# Patient Record
Sex: Female | Born: 1995 | Race: White | Hispanic: No | Marital: Single | Smoking: Former smoker
Health system: Southern US, Community
[De-identification: ages and names within clinical notes are randomized; demographics above are authoritative.]

## PROBLEM LIST (undated history)

## (undated) ENCOUNTER — Inpatient Hospital Stay: Payer: Self-pay

## (undated) DIAGNOSIS — F32A Depression, unspecified: Secondary | ICD-10-CM

## (undated) DIAGNOSIS — F329 Major depressive disorder, single episode, unspecified: Secondary | ICD-10-CM

## (undated) DIAGNOSIS — F419 Anxiety disorder, unspecified: Secondary | ICD-10-CM

## (undated) DIAGNOSIS — G43019 Migraine without aura, intractable, without status migrainosus: Secondary | ICD-10-CM

## (undated) DIAGNOSIS — Z789 Other specified health status: Secondary | ICD-10-CM

## (undated) HISTORY — DX: Depression, unspecified: F32.A

## (undated) HISTORY — DX: Anxiety disorder, unspecified: F41.9

## (undated) HISTORY — PX: NO PAST SURGERIES: SHX2092

## (undated) HISTORY — DX: Migraine without aura, intractable, without status migrainosus: G43.019

## (undated) HISTORY — DX: Major depressive disorder, single episode, unspecified: F32.9

---
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
## 2015-08-22 ENCOUNTER — Ambulatory Visit: Payer: Self-pay | Admitting: Obstetrics and Gynecology

## 2015-12-26 ENCOUNTER — Emergency Department (HOSPITAL_COMMUNITY)
Admission: EM | Admit: 2015-12-26 | Discharge: 2015-12-26 | Disposition: A | Discharge status: Home / Self Care | Payer: Medicaid Other | Attending: Emergency Medicine | Admitting: Emergency Medicine

## 2015-12-26 ENCOUNTER — Encounter (HOSPITAL_COMMUNITY): Payer: Self-pay

## 2015-12-26 DIAGNOSIS — N760 Acute vaginitis: Secondary | ICD-10-CM | POA: Insufficient documentation

## 2015-12-26 DIAGNOSIS — Z3202 Encounter for pregnancy test, result negative: Secondary | ICD-10-CM | POA: Insufficient documentation

## 2015-12-26 DIAGNOSIS — B9689 Other specified bacterial agents as the cause of diseases classified elsewhere: Secondary | ICD-10-CM | POA: Diagnosis not present

## 2015-12-26 DIAGNOSIS — Z3200 Encounter for pregnancy test, result unknown: Secondary | ICD-10-CM | POA: Diagnosis present

## 2015-12-26 DIAGNOSIS — Z87891 Personal history of nicotine dependence: Secondary | ICD-10-CM | POA: Insufficient documentation

## 2015-12-26 LAB — I-STAT BETA HCG BLOOD, ED (MC, WL, AP ONLY): I-stat hCG, quantitative: <5 m[IU]/mL (ref <5)

## 2015-12-26 LAB — URINE MICROSCOPIC-ADD ON

## 2015-12-26 LAB — URINALYSIS, ROUTINE W REFLEX MICROSCOPIC
BILIRUBIN URINE: NEGATIVE
Glucose, UA: NEGATIVE mg/dL
Hgb urine dipstick: NEGATIVE
KETONES UR: NEGATIVE mg/dL
NITRITE: NEGATIVE
PROTEIN: NEGATIVE mg/dL
SPECIFIC GRAVITY, URINE: 1.017 (ref 1.005–1.030)
pH: 6 (ref 5.0–8.0)

## 2015-12-26 LAB — WET PREP, GENITAL
Sperm: NONE SEEN
TRICH WET PREP: NONE SEEN
YEAST WET PREP: NONE SEEN

## 2015-12-26 MED ORDER — METRONIDAZOLE 500 MG PO TABS: 500.0000 mg | ORAL_TABLET | Freq: Two times a day (BID) | ORAL | 0 refills | Status: DC

## 2015-12-26 NOTE — ED Triage Notes (Signed)
Pt reports nausea when she wakes up in the morning. She also has cravings and reports her LMP was 2 months ago.

## 2015-12-26 NOTE — ED Provider Notes (Signed)
[facility]-EMERGENCY DEPT Provider Note   CSN: [id number] Arrival date & time: 12/26/15  1704     History   Chief Complaint Chief Complaint  Patient presents with  . Possible Pregnancy    HPI Glenda Reid is a 20 y.o. female.  HPI   Pt presents to ED requesting pregnancy test.  States she has not had a period in several months - she thinks it may have been 3 months.  Has been having nausea and increased hunger, occasional lower abdominal cramping that is mild (not currently present), and increased white vaginal discharge daily x 3 months.  Denies fevers, vomiting, urinary symptoms, bowel changes, vaginal bleeding.   History reviewed. No pertinent past medical history.  There are no active problems to display for this patient.   History reviewed. No pertinent surgical history.  OB History    No data available       Home Medications    Prior to Admission medications   Medication Sig Start Date End Date Taking? Authorizing Provider  metroNIDAZOLE (FLAGYL) 500 MG tablet Take 1 tablet (500 mg total) by mouth 2 (two) times daily. 12/26/15   Trixie DredgeEmily Talayla Doyel, PA-C  naproxen (NAPROSYN) 500 MG tablet Take 1 tablet (500 mg total) by mouth 2 (two) times daily with a meal. 12/28/14   Joni Reiningonald K Smith, PA-C    Family History History reviewed. No pertinent family history.  Social History Social History  Substance Use Topics  . Smoking status: Former Games developermoker  . Smokeless tobacco: Never Used  . Alcohol use No     Allergies   Citalopram   Review of Systems Review of Systems  All other systems reviewed and are negative.    Physical Exam Updated Vital Signs BP 137/76 (BP Location: Left Arm)   Pulse 104   Temp 98.3 F (36.8 C) (Oral)   Resp 18   Ht 5\' 2"  (1.575 m)   Wt 53.5 kg   LMP 10/26/2015 (Within Weeks)   SpO2 100%   BMI 21.58 kg/m   Physical Exam  Constitutional: She appears well-developed and well-nourished. No distress.  HENT:  Head: Normocephalic and  atraumatic.  Neck: Neck supple.  Cardiovascular: Normal rate and regular rhythm.   Pulmonary/Chest: Effort normal and breath sounds normal. No respiratory distress. She has no wheezes. She has no rales.  Abdominal: Soft. She exhibits no distension. There is no tenderness. There is no rebound and no guarding.  Genitourinary: Uterus is not enlarged and not tender. Cervix exhibits no motion tenderness. Right adnexum displays no mass, no tenderness and no fullness. Left adnexum displays no mass, no tenderness and no fullness. No erythema, tenderness or bleeding in the vagina. No foreign body in the vagina. No signs of injury around the vagina.  Genitourinary Comments: Small amount of white discharge in vagina.    Neurological: She is alert.  Skin: She is not diaphoretic.  Nursing note and vitals reviewed.    ED Treatments / Results  Labs (all labs ordered are listed, but only abnormal results are displayed) Labs Reviewed  WET PREP, GENITAL - Abnormal; Notable for the following:       Result Value   Clue Cells Wet Prep HPF POC PRESENT (*)    WBC, Wet Prep HPF POC MANY (*)    All other components within normal limits  URINALYSIS, ROUTINE W REFLEX MICROSCOPIC (NOT AT [facility]) - Abnormal; Notable for the following:    APPearance CLOUDY (*)    Leukocytes, UA TRACE (*)  All other components within normal limits  URINE MICROSCOPIC-ADD ON - Abnormal; Notable for the following:    Squamous Epithelial / LPF 6-30 (*)    Bacteria, UA FEW (*)    All other components within normal limits  RPR  HIV ANTIBODY (ROUTINE TESTING)  I-STAT BETA HCG BLOOD, ED ([facility], [facility], [facility] ONLY)  GC/CHLAMYDIA PROBE AMP ([place]) NOT AT [facility]    EKG  EKG Interpretation None       Radiology No results found.  Procedures Procedures (including critical care time)  Medications Ordered in ED Medications - No data to display   Initial Impression / Assessment and Plan / ED Course  I have reviewed the triage vital  signs and the nursing notes.  Pertinent labs & imaging results that were available during my care of the patient were reviewed by me and considered in my medical decision making (see chart for details).  Clinical Course    Afebrile, nontoxic patient with symptoms of early pregnancy, no period in several months.  HCG negative.  Pelvic exam unremarkable.  Wet prep demonstrates clue cells and WBC.  STD/HIV pending.  UAdoes not appear infected.   D/C home with flagyl, gyn follow up.  Discussed result, findings, treatment, and follow up  with patient.  Pt given return precautions.  Pt verbalizes understanding and agrees with plan.       Final Clinical Impressions(s) / ED Diagnoses   Final diagnoses:  BV (bacterial vaginosis)    New Prescriptions New Prescriptions   METRONIDAZOLE (FLAGYL) 500 MG TABLET    Take 1 tablet (500 mg total) by mouth 2 (two) times daily.     Trixie Dredge, PA-C 12/26/15 2110    Pricilla Loveless, MD 12/29/15 [phone]

## 2015-12-26 NOTE — Discharge Instructions (Signed)
Read the information below.  Use the prescribed medication as directed.  Please discuss all new medications with your pharmacist.  You may return to the Emergency Department at any time for worsening condition or any new symptoms that concern you.    °

## 2015-12-27 LAB — GC/CHLAMYDIA PROBE AMP (~~LOC~~) NOT AT ARMC
Chlamydia: NEGATIVE
NEISSERIA GONORRHEA: NEGATIVE

## 2015-12-27 LAB — HIV ANTIBODY (ROUTINE TESTING W REFLEX): HIV Screen 4th Generation wRfx: NONREACTIVE

## 2015-12-27 LAB — RPR: RPR: NONREACTIVE

## 2016-02-17 ENCOUNTER — Encounter (HOSPITAL_COMMUNITY): Payer: Self-pay | Admitting: Emergency Medicine

## 2016-02-17 ENCOUNTER — Emergency Department (HOSPITAL_COMMUNITY)
Admission: EM | Admit: 2016-02-17 | Discharge: 2016-02-17 | Disposition: A | Discharge status: Home / Self Care | Payer: Medicaid Other | Attending: Emergency Medicine | Admitting: Emergency Medicine

## 2016-02-17 DIAGNOSIS — Z3201 Encounter for pregnancy test, result positive: Secondary | ICD-10-CM | POA: Insufficient documentation

## 2016-02-17 DIAGNOSIS — Z3A01 Less than 8 weeks gestation of pregnancy: Secondary | ICD-10-CM

## 2016-02-17 DIAGNOSIS — Z87891 Personal history of nicotine dependence: Secondary | ICD-10-CM | POA: Insufficient documentation

## 2016-02-17 DIAGNOSIS — Z3200 Encounter for pregnancy test, result unknown: Secondary | ICD-10-CM | POA: Diagnosis present

## 2016-02-17 LAB — POC URINE PREG, ED: PREG TEST UR: POSITIVE — AB

## 2016-02-17 MED ORDER — DOXYLAMINE-PYRIDOXINE 10-10 MG PO TBEC: 1.0000 | DELAYED_RELEASE_TABLET | ORAL | 0 refills | Status: AC

## 2016-02-17 MED ORDER — PRENATAL COMPLETE 14-0.4 MG PO TABS: 1.0000 | ORAL_TABLET | Freq: Every day | ORAL | 0 refills | Status: DC

## 2016-02-17 NOTE — ED Notes (Signed)
See providers assessment.  

## 2016-02-17 NOTE — ED Triage Notes (Signed)
Requesting pregnancy test.  States she had 2 positive test at home.

## 2016-02-17 NOTE — ED Provider Notes (Signed)
[facility] Provider Note   CSN: [id number] Arrival date & time: 02/17/16  1709 By signing my name below, I, Bridgette HabermannMaria Tan, attest that this documentation has been prepared under the direction and in the presence of Alvira MondayErin Christan Defranco, MD. Electronically Signed: Bridgette HabermannMaria Tan, ED Scribe. 02/17/16. 6:27 PM.  History   Chief Complaint Chief Complaint  Patient presents with  . Possible Pregnancy   HPI Comments: Glenda Reid is a 20 y.o. female with no pertinent PMHx, who presents to the Emergency Department requesting a pregnancy test. Pt states she had 2 positive pregnancy tests at home yesterday and is requesting an official test in order to avail pregnancy medicaid. Pt is complaining of nausea.  No abdominal pain, Reports bilateral hip pain. Pt's LMP was in November. Pt does not have an OB-GYN at this time. Pt denies fever, abd pain, vaginal bleeding, dysuria, vomiting, or any other associated symptoms.   The history is provided by the patient. No language interpreter was used.    History reviewed. No pertinent past medical history.  There are no active problems to display for this patient.   History reviewed. No pertinent surgical history.  OB History    No data available       Home Medications    Prior to Admission medications   Medication Sig Start Date End Date Taking? Authorizing Provider  metroNIDAZOLE (FLAGYL) 500 MG tablet Take 1 tablet (500 mg total) by mouth 2 (two) times daily. 12/26/15   Trixie DredgeEmily West, PA-C    Family History No family history on file.  Social History Social History  Substance Use Topics  . Smoking status: Former Games developermoker  . Smokeless tobacco: Never Used  . Alcohol use No     Allergies   Citalopram   Review of Systems Review of Systems  Constitutional: Negative for fever.  Gastrointestinal: Positive for nausea. Negative for abdominal pain, constipation, diarrhea and vomiting.  Genitourinary: Negative for dysuria, vaginal bleeding and  vaginal discharge.     Physical Exam Updated Vital Signs BP 119/74 (BP Location: Left Arm)   Pulse 86   Temp 98.4 F (36.9 C) (Oral)   Resp 18   LMP  (Approximate) Comment: "Sometime in November"- doesn't remember when  SpO2 100%   Physical Exam  Constitutional: She appears well-developed and well-nourished.  HENT:  Head: Normocephalic.  Eyes: Conjunctivae are normal.  Cardiovascular: Normal rate.   Pulmonary/Chest: Effort normal. No respiratory distress.  Abdominal: Soft. She exhibits no distension. There is no tenderness. There is no CVA tenderness.  Musculoskeletal: Normal range of motion.  Neurological: She is alert.  Skin: Skin is warm and dry.  Psychiatric: She has a normal mood and affect. Her behavior is normal.  Nursing note and vitals reviewed.  ED Treatments / Results  DIAGNOSTIC STUDIES: Oxygen Saturation is 100% on RA, normal by my interpretation.    COORDINATION OF CARE: 6:27 PM Discussed treatment plan with pt at bedside and pt agreed to plan.  Labs (all labs ordered are listed, but only abnormal results are displayed) Labs Reviewed  POC URINE PREG, ED - Abnormal; Notable for the following:       Result Value   Preg Test, Ur POSITIVE (*)    All other components within normal limits    EKG  EKG Interpretation None       Radiology No results found.  Procedures Procedures (including critical care time)  Medications Ordered in ED Medications - No data to display   Initial Impression / Assessment  and Plan / ED Course  I have reviewed the triage vital signs and the nursing notes.  Pertinent labs & imaging results that were available during my care of the patient were reviewed by me and considered in my medical decision making (see chart for details).  Clinical Course    20yo female presents with wanting confirmation of pregnancy.  Patient without abdominal pain, no vaginal bleeding, do not suspect ectopic at this time.  Feel further  screening is appropriate to do through OBGYN or PCP. Given rx for diclegis and prenatal vitamins. Patient discharged in stable condition with understanding of reasons to return.   Final Clinical Impressions(s) / ED Diagnoses   Final diagnoses:  Less than [redacted] weeks gestation of pregnancy    New Prescriptions Current Discharge Medication List     I personally performed the services described in this documentation, which was scribed in my presence. The recorded information has been reviewed and is accurate.     Alvira MondayErin Bell Carbo, MD 02/18/16 [phone]
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
## 2016-03-18 ENCOUNTER — Other Ambulatory Visit (HOSPITAL_COMMUNITY): Payer: Self-pay | Admitting: Nurse Practitioner

## 2016-03-18 DIAGNOSIS — Z3A13 13 weeks gestation of pregnancy: Secondary | ICD-10-CM

## 2016-03-18 DIAGNOSIS — Z3682 Encounter for antenatal screening for nuchal translucency: Secondary | ICD-10-CM

## 2016-04-11 ENCOUNTER — Encounter (HOSPITAL_COMMUNITY): Payer: Self-pay | Admitting: *Deleted

## 2016-04-14 ENCOUNTER — Ambulatory Visit (HOSPITAL_COMMUNITY)
Admission: RE | Admit: 2016-04-14 | Discharge: 2016-04-14 | Disposition: A | Discharge status: Home / Self Care | Payer: Medicaid Other | Source: Ambulatory Visit | Attending: Nurse Practitioner | Admitting: Nurse Practitioner

## 2016-04-14 ENCOUNTER — Encounter (HOSPITAL_COMMUNITY): Payer: Self-pay

## 2016-04-14 DIAGNOSIS — Z3A12 12 weeks gestation of pregnancy: Secondary | ICD-10-CM | POA: Diagnosis not present

## 2016-04-14 DIAGNOSIS — Z3A13 13 weeks gestation of pregnancy: Secondary | ICD-10-CM

## 2016-04-14 DIAGNOSIS — Z3682 Encounter for antenatal screening for nuchal translucency: Secondary | ICD-10-CM | POA: Insufficient documentation

## 2016-04-14 HISTORY — DX: Other specified health status: Z78.9

## 2016-04-16 ENCOUNTER — Other Ambulatory Visit: Payer: Self-pay

## 2016-07-11 ENCOUNTER — Ambulatory Visit (INDEPENDENT_AMBULATORY_CARE_PROVIDER_SITE_OTHER): Payer: Self-pay | Admitting: Neurology

## 2016-07-11 ENCOUNTER — Telehealth: Payer: Self-pay | Admitting: Neurology

## 2016-07-11 ENCOUNTER — Encounter: Payer: Self-pay | Admitting: Neurology

## 2016-07-11 ENCOUNTER — Encounter (INDEPENDENT_AMBULATORY_CARE_PROVIDER_SITE_OTHER): Payer: Self-pay

## 2016-07-11 DIAGNOSIS — G43611 Persistent migraine aura with cerebral infarction, intractable, with status migrainosus: Secondary | ICD-10-CM

## 2016-07-11 DIAGNOSIS — I639 Cerebral infarction, unspecified: Secondary | ICD-10-CM

## 2016-07-11 NOTE — Telephone Encounter (Signed)
This patient arrived without her $3 co-pay, she therefore no showed for the appointment.

## 2016-07-13 ENCOUNTER — Encounter: Payer: Self-pay | Admitting: Neurology

## 2016-07-13 DIAGNOSIS — G43611 Persistent migraine aura with cerebral infarction, intractable, with status migrainosus: Secondary | ICD-10-CM | POA: Insufficient documentation

## 2016-07-13 DIAGNOSIS — I639 Cerebral infarction, unspecified: Principal | ICD-10-CM

## 2016-07-13 NOTE — Progress Notes (Signed)
Patient was not see, did not have copay.

## 2016-07-18 ENCOUNTER — Ambulatory Visit: Payer: Medicaid Other | Admitting: Neurology

## 2016-07-30 ENCOUNTER — Ambulatory Visit (INDEPENDENT_AMBULATORY_CARE_PROVIDER_SITE_OTHER): Payer: Medicaid Other | Admitting: Neurology

## 2016-07-30 ENCOUNTER — Encounter: Payer: Self-pay | Admitting: Neurology

## 2016-07-30 DIAGNOSIS — G43019 Migraine without aura, intractable, without status migrainosus: Secondary | ICD-10-CM | POA: Diagnosis not present

## 2016-07-30 HISTORY — DX: Migraine without aura, intractable, without status migrainosus: G43.019

## 2016-07-30 MED ORDER — AMITRIPTYLINE HCL 10 MG PO TABS: ORAL_TABLET | ORAL | 3 refills | Status: AC

## 2016-07-30 NOTE — Progress Notes (Signed)
Reason for visit: Migraine headache  Referring physician: Dr. Tawana Scale is a 21 y.o. female  History of present illness:  Ms. Calder is a 21 year old right-handed white female with history of migraine headaches throughout her life. She believes that she began having headaches around age 21 or 69. Over the last year the headaches become more frequent and have occurred on an every other day basis. The patient currently is pregnant, the due date is on 10/25/2016. The patient has a history of daily marijuana use, but she has apparently stopped after she learned she was pregnant. The patient indicates that her headaches generally are in the right temporal area, occasionally they may be on the left side of the head. The headaches are associated with photophobia and phonophobia and some nausea without vomiting. When the headaches are severe she may have some cognitive clouding and dizziness with the headache. The patient denies any numbness or weakness of the face, arms, or legs. Currently she is taking Tylenol for the headache, she has found that sleep will also help the headache. She takes Flexeril occasionally if needed as well. The patient indicates that her mother and her brother also had migraine headache. The patient does not know of any activating factors for her headache. She is sent to this office for an evaluation. She has never had a CT scan or MRI of the brain to evaluate her headache.  Past Medical History:  Diagnosis Date  . Anxiety   . Depression   . Medical history non-contributory     Past Surgical History:  Procedure Laterality Date  . NO PAST SURGERIES      Family History  Problem Relation Age of Onset  . Seizures Mother   . Seizures Maternal Grandmother     Social history:  reports that she has quit smoking. She has never used smokeless tobacco. She reports that she uses drugs, including Marijuana. She reports that she does not drink  alcohol.  Medications:  Prior to Admission medications   Medication Sig Start Date End Date Taking? Authorizing Provider  Acetaminophen (TYLENOL PO) Take 1 Dose by mouth as needed.   Yes [provider]  cyclobenzaprine (FLEXERIL) 10 MG tablet Take 10 mg by mouth every 8 (eight) hours as needed for muscle spasms.   Yes [provider]  Doxylamine-Pyridoxine 10-10 MG TBEC Take 1 tablet by mouth See admin instructions. 2 tablets orally at bedtime on day one and two, if symptoms persist, take 1 tablet in the morning and 2 tablets at bedtime on day 3, if symptoms persist, may increase to MAX 4 tablets per day, administered as 1 tablet in the morning, 1 tablet in mid afternoon and 2 tablets at bedtime 02/17/16  Yes Alvira Monday, MD  metroNIDAZOLE (FLAGYL) 500 MG tablet Take 1 tablet (500 mg total) by mouth 2 (two) times daily. 12/26/15  Yes West, Irving Burton, PA-C  Prenatal Vit-Fe Fumarate-FA (PRENATAL COMPLETE) 14-0.4 MG TABS Take 1 tablet by mouth daily. 02/17/16  Yes Alvira Monday, MD      Allergies  Allergen Reactions  . Citalopram Rash    ROS:  Out of a complete 14 system review of symptoms, the patient complains only of the following symptoms, and all other reviewed systems are negative.  Headache, dizziness  Blood pressure 108/60, pulse (!) 108, height 5\' 2"  (1.575 m), weight 134 lb (60.8 kg), last menstrual period 01/14/2016.  Physical Exam  General: The patient is alert and cooperative at the time  of the examination. The patient is pregnant.  Eyes: Pupils are equal, round, and reactive to light. Discs are flat bilaterally.  Neck: The neck is supple, no carotid bruits are noted.  Respiratory: The respiratory examination is clear.  Cardiovascular: The cardiovascular examination reveals a regular rate and rhythm, no obvious murmurs or rubs are noted.  Neuromuscular: Range of movement of the cervical spine is full. No crepitus within the temporomandibular  joints is noted.  Skin: Extremities are without significant edema.  Neurologic Exam  Mental status: The patient is alert and oriented x 3 at the time of the examination. The patient has apparent normal recent and remote memory, with an apparently normal attention span and concentration ability.  Cranial nerves: Facial symmetry is present. There is good sensation of the face to pinprick and soft touch bilaterally. The strength of the facial muscles and the muscles to head turning and shoulder shrug are normal bilaterally. Speech is well enunciated, no aphasia or dysarthria is noted. Extraocular movements are full. Visual fields are full. The tongue is midline, and the patient has symmetric elevation of the soft palate. No obvious hearing deficits are noted.  Motor: The motor testing reveals 5 over 5 strength of all 4 extremities. Good symmetric motor tone is noted throughout.  Sensory: Sensory testing is intact to pinprick, soft touch, vibration sensation, and position sense on all 4 extremities. No evidence of extinction is noted.  Coordination: Cerebellar testing reveals good finger-nose-finger and heel-to-shin bilaterally.  Gait and station: Gait is normal. Tandem gait is normal. Romberg is negative. No drift is seen.  Reflexes: Deep tendon reflexes are symmetric and normal bilaterally. Toes are downgoing bilaterally.   Assessment/Plan:  1. Common migraine headache, intractable  The patient will be placed on low-dose amitriptyline, she will call for any dose adjustments. The patient may take Tylenol if needed for the headache. She will call if any problems arise. Amitriptyline is safe during pregnancy and during breast-feeding. She will follow-up in 3 months.   Marlan Palau. Keith Willis MD 07/30/2016 8:30 AM  [facility] [address] [place], [place] [id number]  Phone [phone] Fax [phone]

## 2016-07-30 NOTE — Patient Instructions (Signed)
We will start amitriptyline at night for the headache.  Elavil (amitriptyline) is an antidepressant medication that has many uses that may include headache, whiplash injuries, or for peripheral neuropathy pain. Side effects may include drowsiness, dry mouth, blurred vision, or constipation. As with any antidepressant medication, worsening depression may occur. If you had any significant side effects, please call our office. The full effects of this medication may take 7-10 days after starting the drug, or going up on the dose.

## 2016-09-01 ENCOUNTER — Observation Stay
Admission: EM | Admit: 2016-09-01 | Discharge: 2016-09-02 | Disposition: A | Discharge status: Home / Self Care | Payer: Medicaid Other | Attending: Obstetrics and Gynecology | Admitting: Obstetrics and Gynecology

## 2016-09-01 DIAGNOSIS — Z888 Allergy status to other drugs, medicaments and biological substances status: Secondary | ICD-10-CM | POA: Diagnosis not present

## 2016-09-01 DIAGNOSIS — O360930 Maternal care for other rhesus isoimmunization, third trimester, not applicable or unspecified: Secondary | ICD-10-CM | POA: Insufficient documentation

## 2016-09-01 DIAGNOSIS — Z3A32 32 weeks gestation of pregnancy: Secondary | ICD-10-CM | POA: Insufficient documentation

## 2016-09-01 DIAGNOSIS — O479 False labor, unspecified: Secondary | ICD-10-CM

## 2016-09-01 DIAGNOSIS — Z87891 Personal history of nicotine dependence: Secondary | ICD-10-CM | POA: Insufficient documentation

## 2016-09-01 DIAGNOSIS — O4693 Antepartum hemorrhage, unspecified, third trimester: Secondary | ICD-10-CM

## 2016-09-01 DIAGNOSIS — O4700 False labor before 37 completed weeks of gestation, unspecified trimester: Secondary | ICD-10-CM

## 2016-09-01 MED ORDER — TERBUTALINE SULFATE 1 MG/ML IJ SOLN
INTRAMUSCULAR | Status: AC
  Administered 2016-09-01: 0.25 mg via SUBCUTANEOUS
  Filled 2016-09-01: qty 1

## 2016-09-01 MED ORDER — TERBUTALINE SULFATE 1 MG/ML IJ SOLN
0.2500 mg | Freq: Once | INTRAMUSCULAR | Status: AC
  Administered 2016-09-01: 0.25 mg via SUBCUTANEOUS

## 2016-09-01 NOTE — OB Triage Note (Signed)
Patient came in for observation for labor evaluation. Patient reports contractions every 5 to 20 minutes. Patient rates pain 5-10 out of 10. Patient states some contractions are very intense and some just feel like menstrual cramps. Patient denies leaking of fluid, denies vaginal bleeding and spotting. Patient states she has been feeling the baby move. Patient denies any other complaints at this time. Vital signs stable and patient afebrile. FHR baseline 130 with moderate variability with accelerations 15 x 15 and no decelerations. Mother at bedside. Will continue to monitor.

## 2016-09-02 ENCOUNTER — Observation Stay: Payer: Medicaid Other

## 2016-09-02 DIAGNOSIS — O4700 False labor before 37 completed weeks of gestation, unspecified trimester: Secondary | ICD-10-CM | POA: Diagnosis present

## 2016-09-02 DIAGNOSIS — O479 False labor, unspecified: Secondary | ICD-10-CM | POA: Diagnosis present

## 2016-09-02 LAB — URINALYSIS, COMPLETE (UACMP) WITH MICROSCOPIC
BILIRUBIN URINE: NEGATIVE
Glucose, UA: 50 mg/dL — AB
HGB URINE DIPSTICK: NEGATIVE
KETONES UR: 20 mg/dL — AB
NITRITE: NEGATIVE
Protein, ur: NEGATIVE mg/dL
SPECIFIC GRAVITY, URINE: 1.004 — AB (ref 1.005–1.030)
pH: 6 (ref 5.0–8.0)

## 2016-09-02 LAB — CHLAMYDIA/NGC RT PCR (ARMC ONLY)
Chlamydia Tr: NOT DETECTED
N gonorrhoeae: NOT DETECTED

## 2016-09-02 LAB — CBC
HCT: 30.1 % — ABNORMAL LOW (ref 35.0–47.0)
Hemoglobin: 10.4 g/dL — ABNORMAL LOW (ref 12.0–16.0)
MCH: 27.4 pg (ref 26.0–34.0)
MCHC: 34.4 g/dL (ref 32.0–36.0)
MCV: 79.6 fL — AB (ref 80.0–100.0)
PLATELETS: 261 10*3/uL (ref 150–440)
RBC: 3.79 MIL/uL — ABNORMAL LOW (ref 3.80–5.20)
RDW: 13.1 % (ref 11.5–14.5)
WBC: 19.7 10*3/uL — AB (ref 3.6–11.0)

## 2016-09-02 LAB — ABO/RH: ABO/RH(D): O NEG

## 2016-09-02 LAB — URINE DRUG SCREEN, QUALITATIVE (ARMC ONLY)
Amphetamines, Ur Screen: NOT DETECTED
BENZODIAZEPINE, UR SCRN: NOT DETECTED
Barbiturates, Ur Screen: NOT DETECTED
CANNABINOID 50 NG, UR ~~LOC~~: NOT DETECTED
Cocaine Metabolite,Ur ~~LOC~~: NOT DETECTED
MDMA (Ecstasy)Ur Screen: NOT DETECTED
Methadone Scn, Ur: NOT DETECTED
Opiate, Ur Screen: NOT DETECTED
PHENCYCLIDINE (PCP) UR S: NOT DETECTED
Tricyclic, Ur Screen: POSITIVE — AB

## 2016-09-02 MED ORDER — LACTATED RINGERS IV SOLN: INTRAVENOUS | Status: DC

## 2016-09-02 MED ORDER — LACTATED RINGERS IV SOLN
Freq: Once | INTRAVENOUS | Status: AC
  Administered 2016-09-02: 01:00:00 via INTRAVENOUS

## 2016-09-02 MED ORDER — TERBUTALINE SULFATE 1 MG/ML IJ SOLN
0.2500 mg | Freq: Once | INTRAMUSCULAR | Status: AC
  Administered 2016-09-02: 0.25 mg via SUBCUTANEOUS

## 2016-09-02 MED ORDER — RHO D IMMUNE GLOBULIN 1500 UNIT/2ML IJ SOSY
300.0000 ug | PREFILLED_SYRINGE | Freq: Once | INTRAMUSCULAR | Status: DC
  Filled 2016-09-02: qty 2
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
## 2016-09-02 NOTE — Discharge Instructions (Signed)

## 2016-09-02 NOTE — H&P (Signed)
Patient ID: Glenda Reid, female   DOB: 12/06/1995, 21 y.o.   MRN: [id number]  Glenda Reid is a 21 y.o. female. She is at [redacted]w[redacted]d gestation. Patient's last menstrual period was 01/14/2016 (approximate). Estimated Date of Delivery: 10/25/16  Prenatal care site: [place]  Chief complaint: Ctx throughout day starting at 4 pm  No LOF , No Vaginal bleeding  Outside most of the day and only consumed 32 oz liquid    Maternal Medical History:   Past Medical History:  Diagnosis Date  . Anxiety   . Common migraine with intractable migraine 07/30/2016  . Depression   . Medical history non-contributory     Past Surgical History:  Procedure Laterality Date  . NO PAST SURGERIES      Allergies  Allergen Reactions  . Citalopram Rash    Prior to Admission medications   Medication Sig Start Date End Date Taking? Authorizing Provider  amitriptyline (ELAVIL) 10 MG tablet Take one tablet at night for one week, then take 2 tablets at night for one week, then take 3 tablets at night. 07/30/16  Yes York Spaniel, MD  Prenatal Vit-Fe Fumarate-FA (PRENATAL COMPLETE) 14-0.4 MG TABS Take 1 tablet by mouth daily. 02/17/16  Yes Alvira Monday, MD  Acetaminophen (TYLENOL PO) Take 1 Dose by mouth as needed.    [provider]  cyclobenzaprine (FLEXERIL) 10 MG tablet Take 10 mg by mouth every 8 (eight) hours as needed for muscle spasms.    [provider]  Doxylamine-Pyridoxine 10-10 MG TBEC Take 1 tablet by mouth See admin instructions. 2 tablets orally at bedtime on day one and two, if symptoms persist, take 1 tablet in the morning and 2 tablets at bedtime on day 3, if symptoms persist, may increase to MAX 4 tablets per day, administered as 1 tablet in the morning, 1 tablet in mid afternoon and 2 tablets at bedtime 02/17/16   Alvira Monday, MD  metroNIDAZOLE (FLAGYL) 500 MG tablet Take 1 tablet (500 mg total) by mouth 2 (two) times daily. Patient not  taking: Reported on 09/01/2016 12/26/15   Trixie Dredge, PA-C     Social History: She  reports that she has quit smoking. She has never used smokeless tobacco. She reports that she uses drugs, including Marijuana. She reports that she does not drink alcohol.  Family History: family history includes Seizures in her maternal grandmother and mother.  no history of gyn cancers  Review of Systems: A full review of systems was performed and negative except as noted in the HPI.   General : no weight loss Endocrine No thyroid disease  Pulm : No SOB , asthma  CV NO chest pain   GI: No IBS  GU , No STD  Neurologic : No seizure d/o  O:  BP 126/82 (BP Location: Right Arm)   Pulse (!) 110   Temp 98.4 F (36.9 C) (Oral)   Resp 18   Ht 5\' 2"  (1.575 m)   Wt 61.2 kg (135 lb)   LMP 01/14/2016 (Approximate)   BMI 24.69 kg/m  No results found for this or any previous visit (from the past 48 hour(s)).   Constitutional: NAD, AAOx3  HE/ENT: extraocular movements grossly intact, moist mucous membranes CV: RRR PULM: nl respiratory effort, CTABL     Abd: gravid, non-tender, non-distended, soft      Ext: Non-tender, Nonedmeatous   Psych: mood appropriate, speech normal Pelvic by RN intially 1.5 cm dilated Exam by me at 0145 on  09/02/16 : TFT with old dark blood , / 50% / BLT   NST: Reactive ,   Baseline: 150 Variability: moderate Accelerations present x >2 Decelerations absent  CTX : uterine irritability  Time 20mins    A/P: 21 y.o. 582w3d here for antenatal surveillance for PTL Contractions / pressure decreased but blood on glove may be c/w cervical change , vs partial abruption   Will get pelvic u/s  Blood type rh negative  And she will receive rhogam  Cont IVF and get CBC and UDS observ over night    ----- Suzy Bouchardhomas J Adden Strout , MD Attending Obstetrician and Gynecologist [facility], Department of OB/GYN [facility]

## 2016-09-04 LAB — BPAM RBC
BLOOD PRODUCT EXPIRATION DATE: 201808012359
Blood Product Expiration Date: 201808012359
UNIT TYPE AND RH: 9500
Unit Type and Rh: 9500

## 2016-09-04 LAB — TYPE AND SCREEN
ABO/RH(D): O NEG
ANTIBODY SCREEN: POSITIVE
UNIT DIVISION: 0
Unit division: 0

## 2016-09-22 ENCOUNTER — Inpatient Hospital Stay
Admission: EM | Admit: 2016-09-22 | Discharge: 2016-09-25 | DRG: 775 | Disposition: A | Discharge status: Home / Self Care | Payer: Medicaid Other | Attending: Obstetrics and Gynecology | Admitting: Obstetrics and Gynecology

## 2016-09-22 ENCOUNTER — Inpatient Hospital Stay: Payer: Medicaid Other

## 2016-09-22 DIAGNOSIS — D649 Anemia, unspecified: Secondary | ICD-10-CM | POA: Diagnosis present

## 2016-09-22 DIAGNOSIS — Z3A35 35 weeks gestation of pregnancy: Secondary | ICD-10-CM

## 2016-09-22 DIAGNOSIS — O9902 Anemia complicating childbirth: Secondary | ICD-10-CM | POA: Diagnosis present

## 2016-09-22 DIAGNOSIS — O4290 Premature rupture of membranes, unspecified as to length of time between rupture and onset of labor, unspecified weeks of gestation: Secondary | ICD-10-CM

## 2016-09-22 DIAGNOSIS — O42913 Preterm premature rupture of membranes, unspecified as to length of time between rupture and onset of labor, third trimester: Secondary | ICD-10-CM | POA: Diagnosis present

## 2016-09-22 DIAGNOSIS — Z87891 Personal history of nicotine dependence: Secondary | ICD-10-CM | POA: Diagnosis not present

## 2016-09-22 DIAGNOSIS — O42919 Preterm premature rupture of membranes, unspecified as to length of time between rupture and onset of labor, unspecified trimester: Secondary | ICD-10-CM | POA: Diagnosis present

## 2016-09-22 LAB — CBC
HCT: 32.8 % — ABNORMAL LOW (ref 35.0–47.0)
Hemoglobin: 10.9 g/dL — ABNORMAL LOW (ref 12.0–16.0)
MCH: 26.3 pg (ref 26.0–34.0)
MCHC: 33.3 g/dL (ref 32.0–36.0)
MCV: 79.1 fL — ABNORMAL LOW (ref 80.0–100.0)
Platelets: 260 10*3/uL (ref 150–440)
RBC: 4.15 MIL/uL (ref 3.80–5.20)
RDW: 13.7 % (ref 11.5–14.5)
WBC: 17.5 10*3/uL — AB (ref 3.6–11.0)

## 2016-09-22 LAB — ROM PLUS (ARMC ONLY): ROM PLUS: POSITIVE

## 2016-09-22 LAB — RAPID HIV SCREEN (HIV 1/2 AB+AG)
HIV 1/2 ANTIBODIES: NONREACTIVE
HIV-1 P24 ANTIGEN - HIV24: NONREACTIVE

## 2016-09-22 MED ORDER — MISOPROSTOL 25 MCG QUARTER TABLET
ORAL_TABLET | ORAL | Status: AC
  Filled 2016-09-22: qty 1

## 2016-09-22 MED ORDER — SODIUM CHLORIDE 0.9 % IV SOLN
2.0000 g | Freq: Once | INTRAVENOUS | Status: AC
  Administered 2016-09-22: 2 g via INTRAVENOUS
  Filled 2016-09-22: qty 2000

## 2016-09-22 MED ORDER — LIDOCAINE HCL (PF) 1 % IJ SOLN
INTRAMUSCULAR | Status: AC
  Filled 2016-09-22: qty 30

## 2016-09-22 MED ORDER — LACTATED RINGERS IV SOLN: 500.0000 mL | INTRAVENOUS | Status: DC | PRN

## 2016-09-22 MED ORDER — DEXTROSE 5 % IV SOLN
1000.0000 mg | Freq: Once | INTRAVENOUS | Status: AC
  Administered 2016-09-22: 1000 mg via INTRAVENOUS
  Filled 2016-09-22: qty 1000

## 2016-09-22 MED ORDER — OXYTOCIN 40 UNITS IN LACTATED RINGERS INFUSION - SIMPLE MED: 2.5000 [IU]/h | INTRAVENOUS | Status: DC

## 2016-09-22 MED ORDER — ACETAMINOPHEN 325 MG PO TABS: 650.0000 mg | ORAL_TABLET | ORAL | Status: DC | PRN

## 2016-09-22 MED ORDER — BUTORPHANOL TARTRATE 1 MG/ML IJ SOLN
1.0000 mg | INTRAMUSCULAR | Status: DC | PRN
  Administered 2016-09-23: 1 mg via INTRAVENOUS
  Filled 2016-09-22 (×2): qty 1

## 2016-09-22 MED ORDER — LIDOCAINE HCL (PF) 1 % IJ SOLN
30.0000 mL | INTRAMUSCULAR | Status: DC | PRN
  Administered 2016-09-23: 30 mL via SUBCUTANEOUS
  Filled 2016-09-22: qty 30

## 2016-09-22 MED ORDER — SOD CITRATE-CITRIC ACID 500-334 MG/5ML PO SOLN: 30.0000 mL | ORAL | Status: DC | PRN

## 2016-09-22 MED ORDER — TERBUTALINE SULFATE 1 MG/ML IJ SOLN: 0.2500 mg | Freq: Once | INTRAMUSCULAR | Status: DC | PRN

## 2016-09-22 MED ORDER — LACTATED RINGERS IV SOLN
INTRAVENOUS | Status: DC
  Administered 2016-09-22: via INTRAVENOUS

## 2016-09-22 MED ORDER — SODIUM CHLORIDE 0.9 % IV SOLN
1.0000 g | INTRAVENOUS | Status: DC
  Administered 2016-09-23 (×2): 1 g via INTRAVENOUS
  Filled 2016-09-22 (×6): qty 1000

## 2016-09-22 MED ORDER — MISOPROSTOL 200 MCG PO TABS
ORAL_TABLET | ORAL | Status: AC
  Filled 2016-09-22: qty 4

## 2016-09-22 MED ORDER — MISOPROSTOL 25 MCG QUARTER TABLET
25.0000 ug | ORAL_TABLET | ORAL | Status: DC | PRN
  Administered 2016-09-22: 25 ug via VAGINAL
  Filled 2016-09-22: qty 1

## 2016-09-22 MED ORDER — AMMONIA AROMATIC IN INHA
RESPIRATORY_TRACT | Status: AC
  Filled 2016-09-22: qty 10

## 2016-09-22 MED ORDER — ONDANSETRON HCL 4 MG/2ML IJ SOLN
4.0000 mg | Freq: Four times a day (QID) | INTRAMUSCULAR | Status: DC | PRN
  Administered 2016-09-23 (×2): 4 mg via INTRAVENOUS
  Filled 2016-09-22 (×2): qty 2

## 2016-09-22 MED ORDER — BETAMETHASONE SOD PHOS & ACET 6 (3-3) MG/ML IJ SUSP
12.0000 mg | Freq: Once | INTRAMUSCULAR | Status: AC
  Administered 2016-09-23: 12 mg via INTRAMUSCULAR
  Filled 2016-09-22: qty 2

## 2016-09-22 MED ORDER — OXYTOCIN 10 UNIT/ML IJ SOLN
INTRAMUSCULAR | Status: AC
  Filled 2016-09-22: qty 2

## 2016-09-22 MED ORDER — OXYTOCIN BOLUS FROM INFUSION
500.0000 mL | Freq: Once | INTRAVENOUS | Status: AC
  Administered 2016-09-23: 500 mL via INTRAVENOUS

## 2016-09-22 NOTE — H&P (Signed)
Glenda Reid is a 21 y.o. female presenting for "LOF x 1 week and again this am". C[facility] to [facility] and initially bloody was seen at the os. ROM plus could not be run due to blood. Throughout the day, there was clear fluid noted on a pad with some brown dc. ON spec exam #2, large amt of pooling seen and looked like amniotic fluid. Definitely not urine or blood. Wet prep neg for hyphae, clue and Trich. Report to Dr Feliberto GottronSchermerhorn earlier and agreed pt should be admitted for delivery with Amp and Azithromycin 1 gm IV now. OB History    Gravida Para Term Preterm AB Living   1         0   SAB TAB Ectopic Multiple Live Births                 Past Medical History:  Diagnosis Date  . Anxiety   . Common migraine with intractable migraine 07/30/2016  . Depression   . Medical history non-contributory    Past Surgical History:  Procedure Laterality Date  . NO PAST SURGERIES     Family History: family history includes Seizures in her maternal grandmother and mother. Social History:  reports that she has quit smoking. She has never used smokeless tobacco. She reports that she uses drugs, including Marijuana. She reports that she does not drink alcohol.     Maternal Diabetes: not found in chart labs Genetic Screening:1st trimester neg  Maternal Ultrasounds/Referrals: dating scan found  12 2/7 w/EDD of 10/25/16 Fetal Ultrasounds or other Referrals: N/A Maternal Substance Abuse:  +Marijuana  Significant Maternal Medications:  PNV Significant Maternal Lab Results: see below Other Comments:  PPROM and GBs not done yet  Review of Systems  Constitutional: Negative.   HENT: Negative.   Eyes: Negative.   Respiratory: Negative.   Cardiovascular: Negative.   Gastrointestinal: Negative.   Genitourinary: Negative.   Musculoskeletal: Negative.   Skin: Negative.   Neurological: Negative.   Endo/Heme/Allergies: Negative.   Psychiatric/Behavioral: Negative.   GYN: Leaking clear fluid x 1 week, today  leaking again History Dilation: Closed Exam by:: Yetta Barre. Reid, CNM Blood pressure 114/74, pulse 98, temperature 98.6 F (37 C), temperature source Oral, resp. rate 18, height 5\' 1"  (1.549 m), weight 64 kg (141 lb), last menstrual period 01/14/2016, SpO2 100 %. Exam  UC: occasional UC seen. FHT: reactive NST with 2 accels 15 x 15 BPM Physical Exam  Gen:21 yo white female in NAD HEENT: Normocephalic, Eyes non-icteric. Heart:S1S2, RRR, No M/R/G Lungs: CTA bilat, no W/R/R Cx: visible closed, +clear pooling of amniotic fluid noted in the vag vault, Neg Nitrazine but, +ROM plus and +ferning.  Prenatal labs: ABO, Rh: --/--/O NEG (07/23 2008) Antibody: POS (07/23 2008) Rubella:   Immune RPR: Non Reactive (10/25 2006)  HBsAg:   neg HIV:   Neg GBS:   unknown  WBC"s:17.5 GCT not found in [place] records Assessment/Plan: A: PPROm at 35 2/7 weeks 2. GBs unknown 3. Anemia P: 1. Admit for IOL due to leaking fluid x 1 week. 2. Baby vtx on US today. 3. Will use Cytotec 25 mcg q 4hours. 4. Continue to monitor UC/FHT's.  5. BMZ 1st dose given in case pt does not deliver for 24 hours 6. OTC Fe after delivery  Glenda Reid 09/22/2016, 11:25 PM

## 2016-09-22 NOTE — OB Triage Note (Signed)
Pt complains of lower abdominal pressure as well

## 2016-09-22 NOTE — OB Triage Note (Signed)
Pt presents c/o LOF since last week. nitrazine negative. Pt states fluid is clear. Pt states she has been nauseous. Denies any bleeding. Reports positive fetal movement. Will continue to monitor.

## 2016-09-22 NOTE — Progress Notes (Signed)
Patient ID: Glenda Reid, female   DOB: 1995/11/04, 21 y.o.   MRN: [id number] TRIAGE VISIT with NST   Glenda Reid is a 21 y.o. G1P0 here for "LOF today". LMP was 12/13/16 with  She is at 5012w2d [place] [facility] [place], by LMP 12/13/16 of  presenting with LOF this am and mom brought her here.US on 09/02/16 with Normal anatomy visualized and no previa or abruption seen.  Indication: LOF today   S: Resting comfortably. No  CTX, no VB. Active fetal movement. Concerned about LOF today.  O:  BP 125/79 (BP Location: Right Arm)   Pulse (!) 109   Temp 98.1 F (36.7 C) (Oral)   Resp 18   Ht 5\' 2"  (1.575 m)   Wt 64 kg (141 lb)   LMP 01/14/2016 (Approximate)   SpO2 100%   BMI 25.79 kg/m  No results found for this or any previous visit (from the past 48 hour(s)).   Gen: NAD, AAOx3      Abd: FNTTP      Ext: Non-tender, Nonedmeatous    NST/FHT:140, +accels to 160, Cat 1, no decels TOCO: quiet Spec exam: IN Lithotomy position with hands under buttocks, no wetness noted on the legs, perineum or on the blue pad. Sterile spec inserted and no fluid noted with cough from cx. Whitish vag dc noted Wet prep done and ferning slide. SVE:  closed visibly, mucoid at the os, +friable with speculum exam.  Wet prep: Neg clue, neg hyphae, neg Trich +ferning noted on the slide  NST: Reactive. See FHT above for particulars.  A/P:  21 y.o. G1P0 1012w2d with LOF this am  Labor: not present.   R/o ROM: +ferning, cannot use nitrazine has pt has friable cx. . Normal amount of white discharge.    Fetal Wellbeing: NST reactive Reassuring Cat 1 tracing. Will observe closely since pt has ferning on the slide. Sharee Pimplearon W. Haddon Fyfe, RN, MSN, CNM, FNP

## 2016-09-22 NOTE — Progress Notes (Signed)
Glenda Reid is a 21 y.o. G1P0 at 287w2d with LOF x 1 week and then told her mom that she was leaking today.  Subjective: I am not hurting  Objective: BP 125/79   Pulse (!) 103   Temp 98.1 F (36.7 C) (Oral)   Resp 18   Ht 5\' 2"  (1.575 m)   Wt 64 kg (141 lb)   LMP 01/14/2016 (Approximate)   SpO2 100%   BMI 25.79 kg/m  No intake/output data recorded. No intake/output data recorded.  FHT: 130, CAT 1, no decels, +accels UC:  Rare SVE:   Dilation: Closed Exam by:: Beatriz Stallion. Jones, CNM  Labs: Lab Results  Component Value Date   WBC 19.7 (H) 09/02/2016   HGB 10.4 (L) 09/02/2016   HCT 30.1 (L) 09/02/2016   MCV 79.6 (L) 09/02/2016   PLT 261 09/02/2016   AFI is 16 cms today which is increased since 2 weeks ago was 13 cms  Assessment / Plan: A:1. IUP at 35 2/7 weeks 2. LOF with +ferning but, neg nitrazine P: ROM plus repeated with spec exam and clear fluid noted in vagina, no blood noted. 2. AFI 16 cms on US today 3. Neg Nitrazine and clear fluid placed on Nitrazine 4. Will wait for the ROM plus to come back.    Sharee Pimplearon W Jones 09/22/2016, 7:06 PM

## 2016-09-23 LAB — CBC
HEMATOCRIT: 33.5 % — AB (ref 35.0–47.0)
HEMOGLOBIN: 11.4 g/dL — AB (ref 12.0–16.0)
MCH: 26.8 pg (ref 26.0–34.0)
MCHC: 34 g/dL (ref 32.0–36.0)
MCV: 78.9 fL — AB (ref 80.0–100.0)
PLATELETS: 263 10*3/uL (ref 150–440)
RBC: 4.24 MIL/uL (ref 3.80–5.20)
RDW: 13.8 % (ref 11.5–14.5)
WBC: 15.8 10*3/uL — AB (ref 3.6–11.0)

## 2016-09-23 LAB — BPAM RBC
Blood Product Expiration Date: 201808042359
Blood Product Expiration Date: 201808052359
UNIT TYPE AND RH: 9500
Unit Type and Rh: 9500

## 2016-09-23 LAB — TYPE AND SCREEN
ABO/RH(D): O NEG
Antibody Screen: POSITIVE
UNIT DIVISION: 0
Unit division: 0

## 2016-09-23 LAB — CHLAMYDIA/NGC RT PCR (ARMC ONLY)
Chlamydia Tr: NOT DETECTED
N gonorrhoeae: NOT DETECTED

## 2016-09-23 MED ORDER — MEASLES, MUMPS & RUBELLA VAC ~~LOC~~ INJ
0.5000 mL | INJECTION | Freq: Once | SUBCUTANEOUS | Status: DC
  Filled 2016-09-23: qty 0.5

## 2016-09-23 MED ORDER — WITCH HAZEL-GLYCERIN EX PADS: 1.0000 "application " | MEDICATED_PAD | CUTANEOUS | Status: DC | PRN

## 2016-09-23 MED ORDER — BISACODYL 10 MG RE SUPP: 10.0000 mg | Freq: Every day | RECTAL | Status: DC | PRN

## 2016-09-23 MED ORDER — ZOLPIDEM TARTRATE 5 MG PO TABS: 5.0000 mg | ORAL_TABLET | Freq: Every evening | ORAL | Status: DC | PRN

## 2016-09-23 MED ORDER — COCONUT OIL OIL: 1.0000 "application " | TOPICAL_OIL | Status: DC | PRN

## 2016-09-23 MED ORDER — DIBUCAINE 1 % RE OINT: 1.0000 "application " | TOPICAL_OINTMENT | RECTAL | Status: DC | PRN

## 2016-09-23 MED ORDER — SODIUM CHLORIDE 0.9 % IV SOLN: 250.0000 mL | INTRAVENOUS | Status: DC | PRN

## 2016-09-23 MED ORDER — AMMONIA AROMATIC IN INHA
RESPIRATORY_TRACT | Status: AC
  Filled 2016-09-23: qty 10

## 2016-09-23 MED ORDER — ACETAMINOPHEN 325 MG PO TABS
650.0000 mg | ORAL_TABLET | ORAL | Status: DC | PRN
  Administered 2016-09-24: 650 mg via ORAL
  Filled 2016-09-23: qty 2

## 2016-09-23 MED ORDER — DIPHENHYDRAMINE HCL 25 MG PO CAPS: 25.0000 mg | ORAL_CAPSULE | Freq: Four times a day (QID) | ORAL | Status: DC | PRN

## 2016-09-23 MED ORDER — IBUPROFEN 600 MG PO TABS
600.0000 mg | ORAL_TABLET | Freq: Four times a day (QID) | ORAL | Status: DC
  Administered 2016-09-23 – 2016-09-25 (×9): 600 mg via ORAL
  Filled 2016-09-23 (×9): qty 1

## 2016-09-23 MED ORDER — SODIUM CHLORIDE 0.9% FLUSH: 3.0000 mL | Freq: Two times a day (BID) | INTRAVENOUS | Status: DC

## 2016-09-23 MED ORDER — ONDANSETRON HCL 4 MG/2ML IJ SOLN: 4.0000 mg | INTRAMUSCULAR | Status: DC | PRN

## 2016-09-23 MED ORDER — ONDANSETRON HCL 4 MG PO TABS
4.0000 mg | ORAL_TABLET | ORAL | Status: DC | PRN
  Administered 2016-09-23 – 2016-09-25 (×6): 4 mg via ORAL
  Filled 2016-09-23 (×6): qty 1

## 2016-09-23 MED ORDER — SENNOSIDES-DOCUSATE SODIUM 8.6-50 MG PO TABS
2.0000 | ORAL_TABLET | ORAL | Status: DC
  Filled 2016-09-23 (×2): qty 2

## 2016-09-23 MED ORDER — SODIUM CHLORIDE 0.9% FLUSH: 3.0000 mL | INTRAVENOUS | Status: DC | PRN

## 2016-09-23 MED ORDER — SIMETHICONE 80 MG PO CHEW: 80.0000 mg | CHEWABLE_TABLET | ORAL | Status: DC | PRN

## 2016-09-23 MED ORDER — FLEET ENEMA 7-19 GM/118ML RE ENEM: 1.0000 | ENEMA | Freq: Every day | RECTAL | Status: DC | PRN

## 2016-09-23 MED ORDER — BENZOCAINE-MENTHOL 20-0.5 % EX AERO
1.0000 "application " | INHALATION_SPRAY | CUTANEOUS | Status: DC | PRN
  Administered 2016-09-24: 1 via TOPICAL
  Filled 2016-09-23: qty 56

## 2016-09-23 MED ORDER — TETANUS-DIPHTH-ACELL PERTUSSIS 5-2.5-18.5 LF-MCG/0.5 IM SUSP: 0.5000 mL | Freq: Once | INTRAMUSCULAR | Status: DC

## 2016-09-23 MED ORDER — PRENATAL MULTIVITAMIN CH
1.0000 | ORAL_TABLET | Freq: Every day | ORAL | Status: DC
  Administered 2016-09-24 – 2016-09-25 (×2): 1 via ORAL
  Filled 2016-09-23 (×3): qty 1

## 2016-09-23 NOTE — Discharge Instructions (Signed)
Postpartum Care After Vaginal Delivery °The period of time right after you deliver your newborn is called the postpartum period. °What kind of medical care will I receive? °· You may continue to receive fluids and medicines through an IV tube inserted into one of your veins. °· If an incision was made near your vagina (episiotomy) or if you had some vaginal tearing during delivery, cold compresses may be placed on your episiotomy or your tear. This helps to reduce pain and swelling. °· You may be given a squirt bottle to use when you go to the bathroom. You may use this until you are comfortable wiping as usual. To use the squirt bottle, follow these steps: °? Before you urinate, fill the squirt bottle with warm water. Do not use hot water. °? After you urinate, while you are sitting on the toilet, use the squirt bottle to rinse the area around your urethra and vaginal opening. This rinses away any urine and blood. °? You may do this instead of wiping. As you start healing, you may use the squirt bottle before wiping yourself. Make sure to wipe gently. °? Fill the squirt bottle with clean water every time you use the bathroom. °· You will be given sanitary pads to wear. °How can I expect to feel? °· You may not feel the need to urinate for several hours after delivery. °· You will have some soreness and pain in your abdomen and vagina. °· If you are breastfeeding, you may have uterine contractions every time you breastfeed for up to several weeks postpartum. Uterine contractions help your uterus return to its normal size. °· It is normal to have vaginal bleeding (lochia) after delivery. The amount and appearance of lochia is often similar to a menstrual period in the first week after delivery. It will gradually decrease over the next few weeks to a dry, yellow-brown discharge. For most women, lochia stops completely by 6-8 weeks after delivery. Vaginal bleeding can vary from woman to woman. °· Within the first few  days after delivery, you may have breast engorgement. This is when your breasts feel heavy, full, and uncomfortable. Your breasts may also throb and feel hard, tightly stretched, warm, and tender. After this occurs, you may have milk leaking from your breasts. Your health care provider can help you relieve discomfort due to breast engorgement. Breast engorgement should go away within a few days. °· You may feel more sad or worried than normal due to hormonal changes after delivery. These feelings should not last more than a few days. If these feelings do not go away after several days, speak with your health care provider. °How should I care for myself? °· Tell your health care provider if you have pain or discomfort. °· Drink enough water to keep your urine clear or pale yellow. °· Wash your hands thoroughly with soap and water for at least 20 seconds after changing your sanitary pads, after using the toilet, and before holding or feeding your baby. °· If you are not breastfeeding, avoid touching your breasts a lot. Doing this can make your breasts produce more milk. °· If you become weak or lightheaded, or you feel like you might faint, ask for help before: °? Getting out of bed. °? Showering. °· Change your sanitary pads frequently. Watch for any changes in your flow, such as a sudden increase in volume, a change in color, the passing of large blood clots. If you pass a blood clot from your vagina, save it   to show to your health care provider. Do not flush blood clots down the toilet without having your health care provider look at them. °· Make sure that all your vaccinations are up to date. This can help protect you and your baby from getting certain diseases. You may need to have immunizations done before you leave the hospital. °· If desired, talk with your health care provider about methods of family planning or birth control (contraception). °How can I start bonding with my baby? °Spending as much time as  possible with your baby is very important. During this time, you and your baby can get to know each other and develop a bond. Having your baby stay with you in your room (rooming in) can give you time to get to know your baby. Rooming in can also help you become comfortable caring for your baby. Breastfeeding can also help you bond with your baby. °How can I plan for returning home with my baby? °· Make sure that you have a car seat installed in your vehicle. °? Your car seat should be checked by a certified car seat installer to make sure that it is installed safely. °? Make sure that your baby fits into the car seat safely. °· Ask your health care provider any questions you have about caring for yourself or your baby. Make sure that you are able to contact your health care provider with any questions after leaving the hospital. °This information is not intended to replace advice given to you by your health care provider. Make sure you discuss any questions you have with your health care provider. °Document Released: 12/15/2006 Document Revised: 07/23/2015 Document Reviewed: 01/22/2015 °Elsevier Interactive Patient Education © 2018 [employer] Inc[employer] ° °

## 2016-09-23 NOTE — Lactation Note (Signed)
This note was copied from a baby's chart. Lactation Consultation Note  Patient Name: Glenda Reid Today's Date: 09/23/2016  I set up DEBP with instructions on use and cleaning and storage/transport of milk. She obtained a few mls of colostrum. [facility], but plans to move with Mom to [place] I encoruaged her to contact them ASAP to secure a breast pump for home use. With her permission I also spoke with [place] [facility] Mickle Mallory(Sarah Austin) about the need for pump and impending move.    Maternal Data    Feeding    [facility]                      Lactation Tools Discussed/Used     Consult Status      Sunday CornSandra Clark Arieal Cuoco 09/23/2016, 3:18 PM

## 2016-09-23 NOTE — Discharge Summary (Signed)
Obstetric Discharge Summary Reason for Admission: induction of labor for PPROM x 1 weeks Prenatal Procedures: [facility] Intrapartum Procedures: spontaneous vaginal delivery Postpartum Procedures: none Complications-Operative and Postpartum: 1st degree Rt and Lt periurethral degree perineal laceration Hemoglobin  Date Value Ref Range Status  09/24/2016 9.8 (L) 12.0 - 16.0 g/dL Final   HCT  Date Value Ref Range Status  09/24/2016 29.4 (L) 35.0 - 47.0 % Final    Physical Exam:  General: a,A,&Ox3 Lungs:CTA bilat, no W/R/R. Heart: S1S2, RRR, No M/R/G Lochia: mod, no clots;  Uterine Fundus: FF and U-1 Laceration: healing and intact DVT Evaluation: Neg Homans  Discharge Diagnoses: Preterm Premature ROM with IOL with NSVD of viable female infant  Discharge Information: Date: 09/25/2016 Activity: No Driving x 2 weeks, No sex x 6 weeks Diet:Regular diet Medications:Ibuprofen, PNV, Fe Condition: Stable  [facility] with delivering provider Discharge to: Home Follow-up Information    [facility] Follow up in 4 week(s).   Specialty:  Home Health Services Why:  birth control, other medical problems Contact information: [address] [place] [address] [phone]        Sharee PimpleJones, Caron W, CNM Follow up in 6 week(s).   Specialty:  Obstetrics and Gynecology Why:  postpartum care Contact information: [address] [facility] [place] [address] [phone]           Newborn Data: Live born female  Birth Weight: 6 lb 5.2 oz (2870 g) APGAR: 8, 8  To stay in NICU for undetermined length of time  [facility] Lana Flaim 09/25/2016, 8:18 AM

## 2016-09-24 LAB — FETAL SCREEN: FETAL SCREEN: NEGATIVE

## 2016-09-24 LAB — CBC
HEMATOCRIT: 29.4 % — AB (ref 35.0–47.0)
HEMOGLOBIN: 9.8 g/dL — AB (ref 12.0–16.0)
MCH: 25.8 pg — AB (ref 26.0–34.0)
MCHC: 33.3 g/dL (ref 32.0–36.0)
MCV: 77.5 fL — ABNORMAL LOW (ref 80.0–100.0)
Platelets: 323 10*3/uL (ref 150–440)
RBC: 3.79 MIL/uL — ABNORMAL LOW (ref 3.80–5.20)
RDW: 14 % (ref 11.5–14.5)
WBC: 23.5 10*3/uL — ABNORMAL HIGH (ref 3.6–11.0)

## 2016-09-24 LAB — RPR: RPR: NONREACTIVE

## 2016-09-24 MED ORDER — FERROUS SULFATE 325 (65 FE) MG PO TABS
325.0000 mg | ORAL_TABLET | Freq: Every day | ORAL | Status: DC
  Administered 2016-09-24 – 2016-09-25 (×2): 325 mg via ORAL
  Filled 2016-09-24 (×2): qty 1

## 2016-09-24 MED ORDER — RHO D IMMUNE GLOBULIN 1500 UNIT/2ML IJ SOSY
300.0000 ug | PREFILLED_SYRINGE | Freq: Once | INTRAMUSCULAR | Status: AC
  Administered 2016-09-24: 300 ug via INTRAMUSCULAR
  Filled 2016-09-24: qty 2

## 2016-09-24 NOTE — Progress Notes (Signed)
Post Partum Day 1 Subjective: Patient sleeping. per nurse, ambulating, voiding and thus far tolerating reg diet.  No CP SOB F/C N/V or leg pain    Objective: BP 104/70 (BP Location: Left Arm)   Pulse 76   Temp 98.6 F (37 C) (Oral)   Resp 18   Ht 5\' 1"  (1.549 m)   Wt 64 kg (141 lb)   LMP 01/14/2016 (Approximate)   SpO2 100%   Breastfeeding? Unknown   BMI 26.64 kg/m    Physical Exam:  deferred   Recent Labs  09/23/16 0117 09/24/16 0619  HGB 11.4* 9.8*  HCT 33.5* 29.4*  WBC 15.8* 23.5*  PLT 263 323    Assessment/Plan: 21 y.o. G1P0101 postpartum day # 1  1. Will reevaluate later today when awake. 2. Continue routine post partum cares. 3. Baby in NICU after 35wk PPROM.   4. Continue inpt dispo    ----- Ranae Plumberhelsea Aarika Moon, MD Attending Obstetrician and Gynecologist [facility]

## 2016-09-24 NOTE — Clinical Social Work Note (Signed)
Please refer to CSW assessment on patient's newborn's medical record in NICU. York SpanielMonica Asah Lamay MSW,LCSW [phone]

## 2016-09-24 NOTE — Lactation Note (Signed)
This note was copied from a baby's chart. Lactation Consultation Note  Patient Name: Girl Blaine HamperKatelynn Ghosh [id number]'s Date: 09/24/2016  Peer counselor from [facility] [place], visited with family today to establish process for obtaining [place] [facility] instead of [place] [facility] as she is moving here now. Ismari plans to bring mom a double electric breast pump to use at home.  Meanwhile, I encouraged mom to use "My NICU Baby" app from [employer] to record pumpings (later feedings) etc to track progress. I gave her breast shells yesterday to wear for right inverted nipple. She had not used them when I saw her last. I reviewed rationale for her trying them. I reviewed pump, storage, cleaning and transporting of milk info. She has LC contact info and WIC info.    Maternal Data    Feeding    LATCH Score/Interventions                      Lactation Tools Discussed/Used     Consult Status      Sunday CornSandra Clark Laquinn Shippy 09/24/2016, 3:52 PM

## 2016-09-25 LAB — RHOGAM INJECTION: Unit division: 0

## 2016-09-25 LAB — SURGICAL PATHOLOGY

## 2016-09-25 MED ORDER — MEDROXYPROGESTERONE ACETATE 150 MG/ML IM SUSP: 150.0000 mg | Freq: Once | INTRAMUSCULAR | Status: DC

## 2016-09-25 NOTE — Progress Notes (Signed)
Patient understands all discharge instructions and the need to make follow up appointments. Patient insisted on walking herself out instead of waiting for a wheelchair.

## 2016-09-27 ENCOUNTER — Ambulatory Visit: Payer: Self-pay
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
## 2016-11-06 ENCOUNTER — Ambulatory Visit: Payer: Medicaid Other | Admitting: Adult Health

## 2016-11-06 ENCOUNTER — Telehealth: Payer: Self-pay

## 2016-11-06 NOTE — Telephone Encounter (Signed)
PATIENT WAS NO SHOW FOR APPT TODAY. 

## 2016-11-07 ENCOUNTER — Encounter: Payer: Self-pay | Admitting: Adult Health

## 2016-12-03 ENCOUNTER — Encounter: Payer: Self-pay | Admitting: Emergency Medicine

## 2016-12-03 ENCOUNTER — Emergency Department
Admission: EM | Admit: 2016-12-03 | Discharge: 2016-12-03 | Disposition: A | Discharge status: Home / Self Care | Payer: Medicaid Other | Attending: Emergency Medicine | Admitting: Emergency Medicine

## 2016-12-03 DIAGNOSIS — R509 Fever, unspecified: Secondary | ICD-10-CM | POA: Diagnosis not present

## 2016-12-03 DIAGNOSIS — Z87891 Personal history of nicotine dependence: Secondary | ICD-10-CM | POA: Insufficient documentation

## 2016-12-03 DIAGNOSIS — K529 Noninfective gastroenteritis and colitis, unspecified: Secondary | ICD-10-CM | POA: Diagnosis not present

## 2016-12-03 DIAGNOSIS — Z79899 Other long term (current) drug therapy: Secondary | ICD-10-CM | POA: Insufficient documentation

## 2016-12-03 DIAGNOSIS — R197 Diarrhea, unspecified: Secondary | ICD-10-CM | POA: Diagnosis present

## 2016-12-03 LAB — COMPREHENSIVE METABOLIC PANEL
ALBUMIN: 4.5 g/dL (ref 3.5–5.0)
ALT: 14 U/L (ref 14–54)
ANION GAP: 9 (ref 5–15)
AST: 19 U/L (ref 15–41)
Alkaline Phosphatase: 71 U/L (ref 38–126)
BUN: 12 mg/dL (ref 6–20)
CALCIUM: 9.5 mg/dL (ref 8.9–10.3)
CHLORIDE: 110 mmol/L (ref 101–111)
CO2: 23 mmol/L (ref 22–32)
Creatinine, Ser: 0.79 mg/dL (ref 0.44–1.00)
GFR calc non Af Amer: >60 mL/min (ref >60)
Glucose, Bld: 108 mg/dL — ABNORMAL HIGH (ref 65–99)
POTASSIUM: 3.9 mmol/L (ref 3.5–5.1)
SODIUM: 142 mmol/L (ref 135–145)
Total Bilirubin: 0.7 mg/dL (ref 0.3–1.2)
Total Protein: 7.9 g/dL (ref 6.5–8.1)

## 2016-12-03 LAB — CBC
HEMATOCRIT: 37.2 % (ref 35.0–47.0)
HEMOGLOBIN: 12.7 g/dL (ref 12.0–16.0)
MCH: 25.9 pg — AB (ref 26.0–34.0)
MCHC: 34.1 g/dL (ref 32.0–36.0)
MCV: 75.9 fL — AB (ref 80.0–100.0)
Platelets: 239 10*3/uL (ref 150–440)
RBC: 4.9 MIL/uL (ref 3.80–5.20)
RDW: 15.6 % — ABNORMAL HIGH (ref 11.5–14.5)
WBC: 10.4 10*3/uL (ref 3.6–11.0)

## 2016-12-03 LAB — URINALYSIS, COMPLETE (UACMP) WITH MICROSCOPIC
Bilirubin Urine: NEGATIVE
GLUCOSE, UA: NEGATIVE mg/dL
Hgb urine dipstick: NEGATIVE
KETONES UR: NEGATIVE mg/dL
Nitrite: NEGATIVE
PROTEIN: NEGATIVE mg/dL
Specific Gravity, Urine: 1.023 (ref 1.005–1.030)
pH: 5 (ref 5.0–8.0)

## 2016-12-03 LAB — LIPASE, BLOOD: LIPASE: 21 U/L (ref 11–51)

## 2016-12-03 MED ORDER — SODIUM CHLORIDE 0.9 % IV BOLUS (SEPSIS)
1000.0000 mL | Freq: Once | INTRAVENOUS | Status: AC
  Administered 2016-12-03: 1000 mL via INTRAVENOUS

## 2016-12-03 MED ORDER — ONDANSETRON HCL 4 MG PO TABS: 4.0000 mg | ORAL_TABLET | Freq: Three times a day (TID) | ORAL | 0 refills | Status: DC | PRN

## 2016-12-03 NOTE — ED Notes (Signed)
Pt gave blood today and passed out later.  No pain   Pt states afteriv fluids I feel fine now.  No n/v/d.  Pt alert.  Skin warm and dry.  Mother with pt.  Iv in place.

## 2016-12-03 NOTE — ED Notes (Signed)
Patient states cramping reoccurs when she is having bowel movement, and continues a short while after.

## 2016-12-03 NOTE — ED Triage Notes (Signed)
presents with fever last pm  Then developed some n/v/d/  Yesterday also  Last diarrhea stool was earlier this am  Positive nausea

## 2016-12-03 NOTE — ED Notes (Signed)
Patient stated her nausea and diarrhea started this morning, her friend was D/C'd this morning with a Dx of Noro virus, but she claims she had no known contact with her or her possessions.

## 2016-12-03 NOTE — ED Provider Notes (Signed)
[facility] Emergency Department Provider Note  ____________________________________________   I have reviewed the triage vital signs and the nursing notes.   HISTORY  Chief Complaint Fever; Diarrhea; and Emesis    HPI Glenda Reid is a 21 y.o. female  who is normally healthy, she presents today saying that she had diarrhea and a low-grade fever earlier today. She has had no recent travel or antibiotics. She states someone with whom she lives has hadnor virus. She has not vomited. She's not had diarrhea in the last hour. No melena bright red blood per rectum, feels much better, would like IV fluids prior to discharge     Past Medical History:  Diagnosis Date  . Anxiety   . Common migraine with intractable migraine 07/30/2016  . Depression   . Medical history non-contributory     Patient Active Problem List   Diagnosis Date Noted  . Preterm premature rupture of membranes (PPROM) delivered, current hospitalization 09/22/2016  . Premature uterine contractions 09/02/2016  . Common migraine with intractable migraine 07/30/2016  . 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 NO PAST SURGERIES      Prior to Admission medications   Medication Sig Start Date End Date Taking? Authorizing Provider  Acetaminophen (TYLENOL PO) Take 1 Dose by mouth as needed.    [provider]  amitriptyline (ELAVIL) 10 MG tablet Take one tablet at night for one week, then take 2 tablets at night for one week, then take 3 tablets at night. 07/30/16   York Spaniel, MD  cyclobenzaprine (FLEXERIL) 10 MG tablet Take 10 mg by mouth every 8 (eight) hours as needed for muscle spasms.    [provider]  Doxylamine-Pyridoxine 10-10 MG TBEC Take 1 tablet by mouth See admin instructions. 2 tablets orally at bedtime on day one and two, if symptoms  persist, take 1 tablet in the morning and 2 tablets at bedtime on day 3, if symptoms persist, may increase to MAX 4 tablets per day, administered as 1 tablet in the morning, 1 tablet in mid afternoon and 2 tablets at bedtime 02/17/16   Alvira Monday, MD  Prenatal Vit-Fe Fumarate-FA (PRENATAL COMPLETE) 14-0.4 MG TABS Take 1 tablet by mouth daily. 02/17/16   Alvira Monday, MD    Allergies Citalopram  Family History  Problem Relation Age of Onset  . Seizures Mother   . Seizures Maternal Grandmother     Social History Social History  Substance Use Topics  . Smoking status: Former Games developer  . Smokeless tobacco: Never Used  . Alcohol use No    Review of Systems Constitutional: No fever/chills Eyes: No visual changes. ENT: No sore throat. No stiff neck no neck pain Cardiovascular: Denies chest pain. Respiratory: Denies shortness of breath. Gastrointestinal:   no vomiting.  Positive diarrhea.  No constipation. Genitourinary: Negative for dysuria. Musculoskeletal: Negative lower extremity swelling Skin: Negative for rash. Neurological: Negative for severe headaches, focal weakness or numbness.   ____________________________________________   PHYSICAL EXAM:  VITAL SIGNS: ED Triage Vitals  Enc Vitals Group     BP 12/03/16 1643 110/67     Pulse Rate 12/03/16 1643 (!) 118     Resp 12/03/16 1643 20     Temp 12/03/16 1643 99.9 F (37.7 C)     Temp Source 12/03/16 1643 Oral     SpO2 12/03/16 1643 99 %     Weight 12/03/16  1641 125 lb (56.7 kg)     Height 12/03/16 1641  (1.575 m)     Head Circumference --      Peak Flow --      Pain Score 12/03/16 1640 9     Pain Loc --      Pain Edu? --      Excl. in GC? --     Constitutional: Alert and oriented. Well appearing and in no acute distress. Eyes: Conjunctivae are normal Head: Atraumatic HEENT: No congestion/rhinnorhea. Mucous membranes are moist.  Oropharynx non-erythematous Neck:   Nontender with no meningismus, no  masses, no stridor Cardiovascular: Normal rate, regular rhythm. Grossly normal heart sounds.  Good peripheral circulation. Respiratory: Normal respiratory effort.  No retractions. Lungs CTAB. Abdominal: Soft and nontender. No distention. No guarding no rebound Back:  There is no focal tenderness or step off.  there is no midline tenderness there are no lesions noted. there is no CVA tenderness Musculoskeletal: No lower extremity tenderness, no upper extremity tenderness. No joint effusions, no DVT signs strong distal pulses no edema Neurologic:  Normal speech and language. No gross focal neurologic deficits are appreciated.  Skin:  Skin is warm, dry and intact. No rash noted. Psychiatric: Mood and affect are normal. Speech and behavior are normal.  ____________________________________________   LABS (all labs ordered are listed, but only abnormal results are displayed)  Labs Reviewed  COMPREHENSIVE METABOLIC PANEL - Abnormal; Notable for the following:       Result Value   Glucose, Bld 108 (*)    All other components within normal limits  CBC - Abnormal; Notable for the following:    MCV 75.9 (*)    MCH 25.9 (*)    RDW 15.6 (*)    All other components within normal limits  LIPASE, BLOOD  URINALYSIS, COMPLETE (UACMP) WITH MICROSCOPIC    Pertinent labs  results that were available during my care of the patient were reviewed by me and considered in my medical decision making (see chart for details). ____________________________________________  EKG  I personally interpreted any EKGs ordered by me or triage  ____________________________________________  RADIOLOGY  Pertinent labs & imaging results that were available during my care of the patient were reviewed by me and considered in my medical decision making (see chart for details). If possible, patient and/or family made aware of any abnormal findings. ____________________________________________     PROCEDURES  Procedure(s) performed: None  Procedures  Critical Care performed: None  ____________________________________________   INITIAL IMPRESSION / ASSESSMENT AND PLAN / ED COURSE  Pertinent labs & imaging results that were available during my care of the patient were reviewed by me and considered in my medical decision making (see chart for details).  Pt with likely viral gastroenteritis, abdomen completely benign not currently having symptoms, blood work thus far reassuring we'll give her IV fluids and hopefully get her home safely. Return precautions polyp given understood.    ____________________________________________   FINAL CLINICAL IMPRESSION(S) / ED DIAGNOSES  Final diagnoses:  None      This chart was dictated using voice recognition software.  Despite best efforts to proofread,  errors can occur which can change meaning.      Jeanmarie Plant, MD 12/03/16 [id number]

## 2017-05-29 ENCOUNTER — Emergency Department
Admission: EM | Admit: 2017-05-29 | Discharge: 2017-05-29 | Disposition: A | Discharge status: Home / Self Care | Payer: Medicaid Other | Attending: Emergency Medicine | Admitting: Emergency Medicine

## 2017-05-29 ENCOUNTER — Telehealth: Payer: Self-pay | Admitting: Emergency Medicine

## 2017-05-29 ENCOUNTER — Encounter: Payer: Self-pay | Admitting: Emergency Medicine

## 2017-05-29 DIAGNOSIS — K029 Dental caries, unspecified: Secondary | ICD-10-CM

## 2017-05-29 DIAGNOSIS — Z79899 Other long term (current) drug therapy: Secondary | ICD-10-CM | POA: Insufficient documentation

## 2017-05-29 DIAGNOSIS — Z87891 Personal history of nicotine dependence: Secondary | ICD-10-CM | POA: Insufficient documentation

## 2017-05-29 MED ORDER — PENICILLIN V POTASSIUM 500 MG PO TABS: 500.0000 mg | ORAL_TABLET | Freq: Four times a day (QID) | ORAL | 0 refills | Status: AC

## 2017-05-29 MED ORDER — LIDOCAINE-EPINEPHRINE 2 %-1:100000 IJ SOLN
1.7000 mL | Freq: Once | INTRAMUSCULAR | Status: AC
  Administered 2017-05-29: 1.7 mL
  Filled 2017-05-29: qty 1.7

## 2017-05-29 NOTE — Telephone Encounter (Signed)
[facility] called to ask if cheaper antibiotic could be used as patient is unable to afford the pen vk even with goodrx.  Per Tonette LedererJenice, we can change to amoxicillin 500 mg 3 times a day for 10 days.

## 2017-05-29 NOTE — ED Notes (Signed)
See triage note  Presents possible dental pain  States she woke up with pain to left upper and lower gumline  Pain radiates into left ear

## 2017-05-29 NOTE — ED Provider Notes (Signed)
[facility] Emergency Department Provider Note ____________________________________________  Time seen: 1352  I have reviewed the triage vital signs and the nursing notes.  HISTORY  Chief Complaint  Dental Pain  HPI Glenda Reid is a 22 y.o. female presents to the ED for evaluation of left-sided jaw and mandible pain.  Patient reports poor dentition, but denies any nausea, vomiting, or fevers.  Also denies any spontaneous purulent drainage from the mouth or gums.  She reports symptoms began last night.  Had no problems controlling his secretions, eating, drinking, or swallowing.  Past Medical History:  Diagnosis Date  . Anxiety   . Common migraine with intractable migraine 07/30/2016  . Depression   . Medical history non-contributory     Patient Active Problem List   Diagnosis Date Noted  . Preterm premature rupture of membranes (PPROM) delivered, current hospitalization 09/22/2016  . Premature uterine contractions 09/02/2016  . Common migraine with intractable migraine 07/30/2016  . 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 NO PAST SURGERIES      Prior to Admission medications   Medication Sig Start Date End Date Taking? Authorizing Provider  Acetaminophen (TYLENOL PO) Take 1 Dose by mouth as needed.    [provider]  amitriptyline (ELAVIL) 10 MG tablet Take one tablet at night for one week, then take 2 tablets at night for one week, then take 3 tablets at night. 07/30/16   York Spaniel, MD  Doxylamine-Pyridoxine 10-10 MG TBEC Take 1 tablet by mouth See admin instructions. 2 tablets orally at bedtime on day one and two, if symptoms persist, take 1 tablet in the morning and 2 tablets at bedtime on day 3, if symptoms persist, may increase to MAX 4 tablets per day, administered as 1 tablet in the morning, 1 tablet in mid  afternoon and 2 tablets at bedtime 02/17/16   Alvira Monday, MD  penicillin v potassium (VEETID) 500 MG tablet Take 1 tablet (500 mg total) by mouth 4 (four) times daily. 05/29/17   Marytza Grandpre, Charlesetta Ivory, PA-C    Allergies Citalopram  Family History  Problem Relation Age of Onset  . Seizures Mother   . Seizures Maternal Grandmother     Social History Social History   Tobacco Use  . Smoking status: Former Games developer  . Smokeless tobacco: Never Used  Substance Use Topics  . Alcohol use: No  . Drug use: Yes    Types: Marijuana    Comment: occasionally    Review of Systems  Constitutional: Negative for fever. Eyes: Negative for visual changes. ENT: Negative for sore throat.  Dental pain as above. Cardiovascular: Negative for chest pain. Respiratory: Negative for shortness of breath. Gastrointestinal: Negative for abdominal pain, vomiting and diarrhea. Musculoskeletal: Negative for back pain. Skin: Negative for rash. Neurological: Negative for headaches, focal weakness or numbness. ____________________________________________  PHYSICAL EXAM:  VITAL SIGNS: ED Triage Vitals  Enc Vitals Group     BP 05/29/17 1242 134/90     Pulse Rate 05/29/17 1242 73     Resp 05/29/17 1242 18     Temp 05/29/17 1242 98.7 F (37.1 C)     Temp Source 05/29/17 1242 Oral     SpO2 05/29/17 1242 99 %     Weight 05/29/17 1237 112 lb (50.8 kg)     Height 05/29/17 1237 5\' 2"  (1.575 m)     Head Circumference --  Peak Flow --      Pain Score 05/29/17 1237 10     Pain Loc --      Pain Edu? --      Excl. in GC? --     Constitutional: Alert and oriented. Well appearing and in no distress. Head: Normocephalic and atraumatic. Eyes: Conjunctivae are normal. PERRL. Normal extraocular movements Ears: Canals clear. TMs intact bilaterally. Mouth/Throat: Mucous membranes are moist.  We will is midline no oropharyngeal lesions are noted.  Patient with no significant focal gum swelling or  tenderness patient does note to have tartar at the gumline globally.  No brawny sublingual edema is appreciated. Neck: Supple. No thyromegaly. Hematological/Lymphatic/Immunological: No cervical lymphadenopathy. Cardiovascular: Normal rate, regular rhythm. Normal distal pulses. Respiratory: Normal respiratory effort. No wheezes/rales/rhonchi. ____________________________________________  DENTAL BLOCK  Performed by: Lissa HoardMenshew, Jomo Forand V Bacon Consent: Verbal consent obtained. Required items: devices and special equipment available Time out: Immediately prior to procedure a "time out" was called to verify the correct patient, procedure, equipment, support staff and site/side marked as required.  Indication: pain Nerve block body site: dental #18 (lower 2nd molar & #16 (upper 3rd molar)   Preparation: Patient was prepped and draped in the usual sterile fashion. Needle gauge: 27 G Location technique: anatomical landmarks  Local anesthetic: lido-epi 2%-1:100,000  Anesthetic total: 1.7 ml  Outcome: pain improved Patient tolerance: Patient tolerated the procedure well with no immediate complications. ____________________________________________  INITIAL IMPRESSION / ASSESSMENT AND PLAN / ED COURSE  Patient with a ED evaluation of left lower and upper jaw pain presumed to be dental in nature.  No signs of any focal dental abscess or spontaneous drainage.  Patient is reporting increased pain after dental block procedure.  She will be discharged with a prescription for amoxicillin to dose as directed.  She will follow-up with 1 of the local community dental providers for definitive treatment. ____________________________________________  FINAL CLINICAL IMPRESSION(S) / ED DIAGNOSES  Final diagnoses:  Pain due to dental caries      Lissa HoardMenshew, Talea Manges V Bacon, PA-C 05/29/17 1515    Minna AntisPaduchowski, Kevin, MD 05/29/17 1859

## 2017-05-29 NOTE — ED Triage Notes (Signed)
Patient presents to the ED with dental pain on the left side of her face.  Patient states pain is radiating to left ear.  Patient states pain began this morning.  No obvious swelling at this time.

## 2017-05-29 NOTE — Discharge Instructions (Addendum)
Take the antibiotic as directed. Follow-up with one of the dental clinics listed below.   OPTIONS FOR DENTAL FOLLOW UP CARE  [place] [facility] of Health and Human Services - Local Safety Net Dental Clinics http://www.ncdhhs.gov/dph/oralhealth/services/safetynetclinics.htm   [facility] ([phone])  [facility] ([phone])  [facility] ([phone])  [facility] ([phone])  [facility] ([phone]) This clinic caters to the indigent population and is on a lottery system. Location: [facility], [address], [place] Clinic Hours: Wednesdays from 6pm - 9pm, patients seen by a lottery system. For dates, call or go to [email or web address] Services: Cleanings, fillings and simple extractions. Payment Options: DENTAL WORK IS FREE OF CHARGE. Bring proof of income or support. Best way to get seen: Arrive at 5:15 pm - this is a lottery, NOT first come/first serve, so arriving earlier will not increase your chances of being seen.     [facility] [phone] Select option 1 for emergencies   Location: [facility], [address], [place] Clinic Hours: No walk-ins accepted - call the day before to schedule an appointment. Check in times are 9:30 am and 1:30 pm. Services: Simple extractions, temporary fillings, pulpectomy/pulp debridement, uncomplicated abscess drainage. Payment Options: PAYMENT IS DUE AT THE TIME OF SERVICE.  Fee is usually $100-200, additional surgical procedures (e.g. abscess drainage) may be extra. Cash, checks, Visa/MasterCard accepted.  Can file Medicaid if patient is covered for dental - patient should call case worker to check. No discount for [facility] patients. Best way to get seen: MUST call the day before and get onto the schedule. Can usually be seen the next  1-2 days. No walk-ins accepted.     [facility] [phone]   Location: [facility], [address], [place] Clinic Hours: M, W, Th, F 8am or 1:30pm, Tues 9a or 1:30 - first come/first served. Services: Simple extractions, temporary fillings, uncomplicated abscess drainage.  You do not need to be an [place] resident. Payment Options: PAYMENT IS DUE AT THE TIME OF SERVICE. Dental insurance, otherwise sliding scale - bring proof of income or support. Depending on income and treatment needed, cost is usually $50-200. Best way to get seen: Arrive early as it is first come/first served.     [facility] [phone]   Location: [address] Clinic Hours: Mon-Thu 8a-5p Services: Most basic dental services including extractions and fillings. Payment Options: PAYMENT IS DUE AT THE TIME OF SERVICE. Sliding scale, up to 50% off - bring proof if income or support. Medicaid with dental option accepted. Best way to get seen: Call to schedule an appointment, can usually be seen within 2 weeks OR they will try to see walk-ins - show up at 8a or 2p (you may have to wait).     [facility] [phone] [place] RESIDENTS ONLY   Location: [facility], [address], [place], [place] [address] Clinic Hours: By appointment only. Monday - Thursday 8am-5pm, Friday 8am-12pm Services: Cleanings, fillings, extractions. Payment Options: PAYMENT IS DUE AT THE TIME OF SERVICE. Cash, Visa or MasterCard. Sliding scale - $30 minimum per service. Best way to get seen: Come in to office, complete packet and make an appointment - need proof of income or support monies for each household member and proof of [place] residence. Usually takes about a month to get in.       [facility] [phone]   Location: [address],  [place] Clinic Hours: Walk-in Urgent Care Dental Services are offered Monday-Friday mornings only. The numbers of emergencies accepted daily is limited to the number of providers available. Maximum 15 - Mondays, Wednesdays & Thursdays Maximum 10 - Tuesdays & Fridays Services: You do not need to be a [facility] resident to be seen for a dental emergency. Emergencies are defined as pain, swelling, abnormal bleeding, or dental trauma. Walkins will receive x-rays if needed. NOTE: Dental cleaning is not an emergency. Payment Options: PAYMENT IS DUE AT THE TIME OF SERVICE. Minimum co-pay is $40.00 for uninsured patients. Minimum co-pay is $3.00 for Medicaid with dental coverage. Dental Insurance is accepted and must be presented at time of visit. Medicare does not cover dental. Forms of payment: Cash, credit card, checks. Best way to get seen: If not previously registered with the clinic, walk-in dental registration begins at 7:15 am and is on a first come/first serve basis. If previously registered with the clinic, call to make an appointment.     [facility] [place] ONLY   Location: [address], [place], [place] Clinic Hours: Mon-Thu 10a-2p Services: Extractions only! Payment Options: FREE (donations accepted) - bring proof of income or support Best way to get seen: Call and schedule an appointment OR come at 8am on the 1st Monday of every month (except for holidays) when it is first come/first served.     [facility] [phone]   Location: [place], [place] Clinic Hours: Friday mornings Services, Payment Options, Best way to get seen: Call for info

## 2018-01-21 ENCOUNTER — Other Ambulatory Visit: Payer: Self-pay | Admitting: Orthopaedic Surgery

## 2018-01-21 DIAGNOSIS — S93326A Dislocation of tarsometatarsal joint of unspecified foot, initial encounter: Secondary | ICD-10-CM

## 2018-02-03 ENCOUNTER — Other Ambulatory Visit: Payer: Self-pay

## 2018-09-30 IMAGING — US US OB COMP +14 WK
1 series · 13 of 28 positions shown · non-contrast
Comparison: none

CLINICAL DATA: Light bloody discharge.

EXAM:
OBSTETRICAL ULTRASOUND >14 WKS

[Series 1: us ob comp +14 wk · 0.26mm/px · 68 acquisitions, 13 frames shown]
[im 3/68]
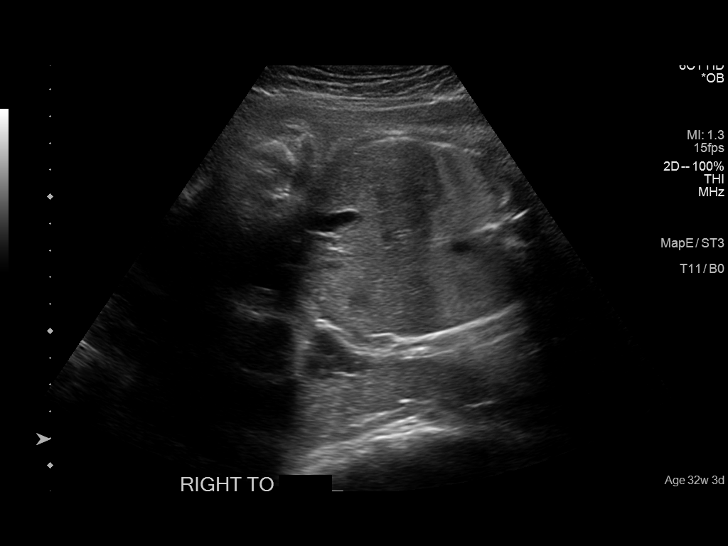
[im 8/68]
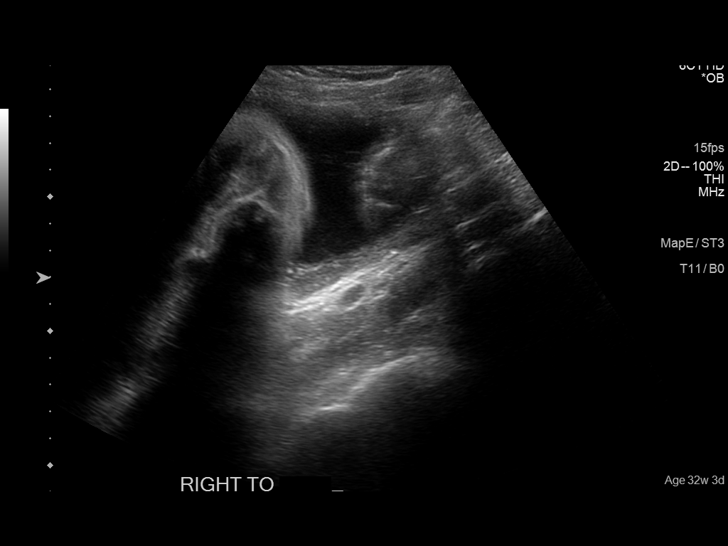
[im 13/68]
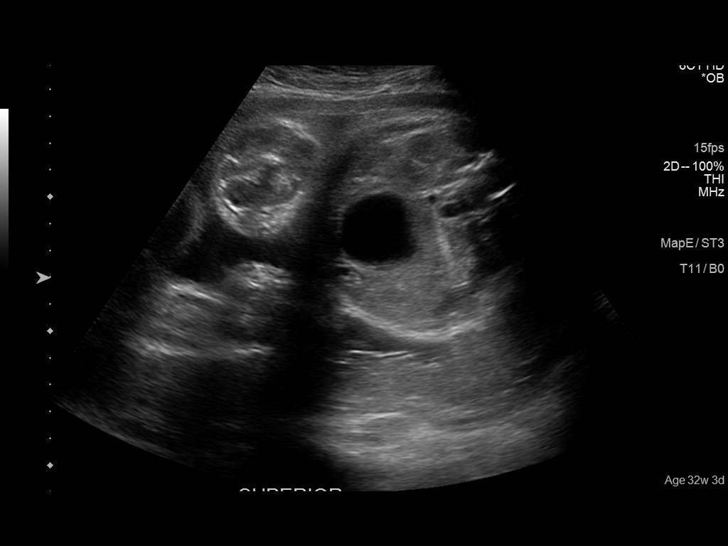
[im 18/68]
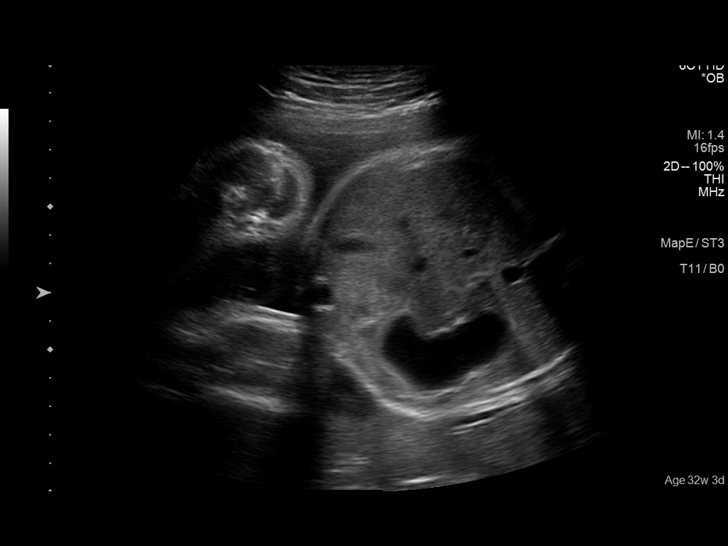
[im 23/68]
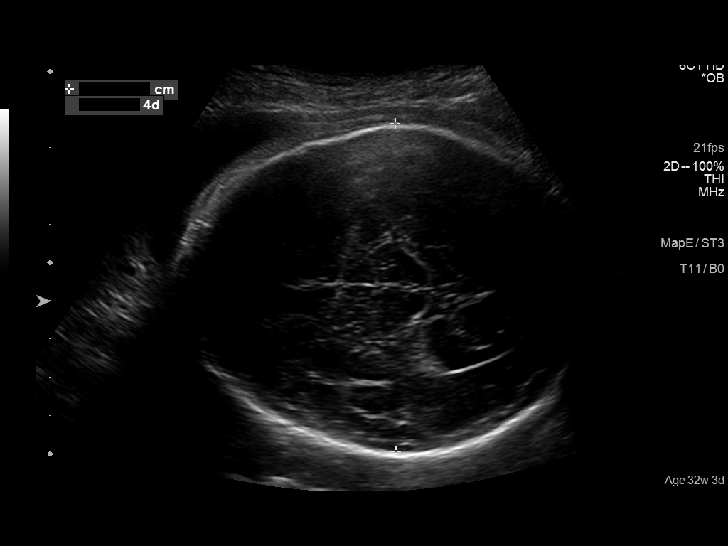
[im 28/68]
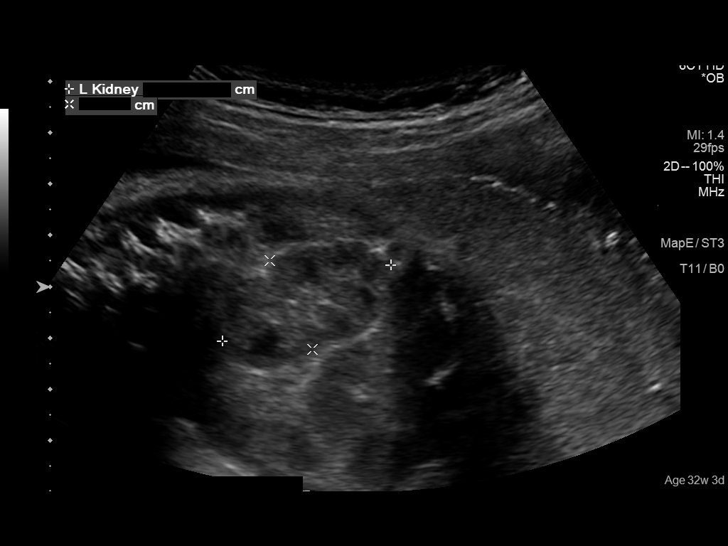
[im 35/68]
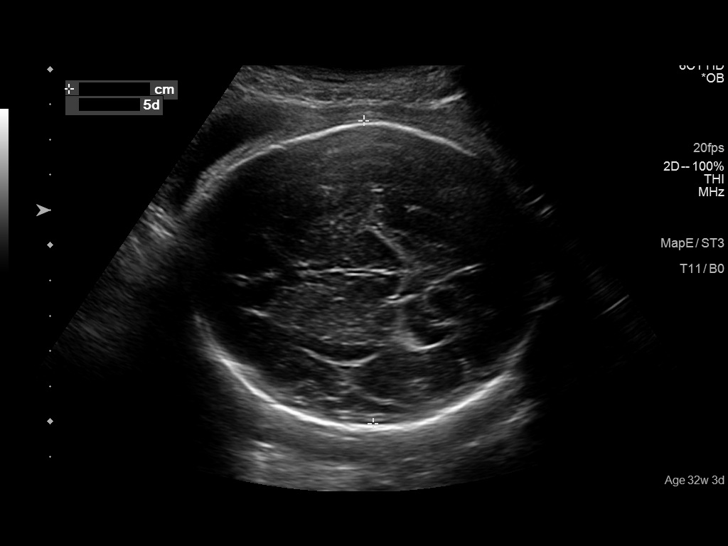
[im 40/68]
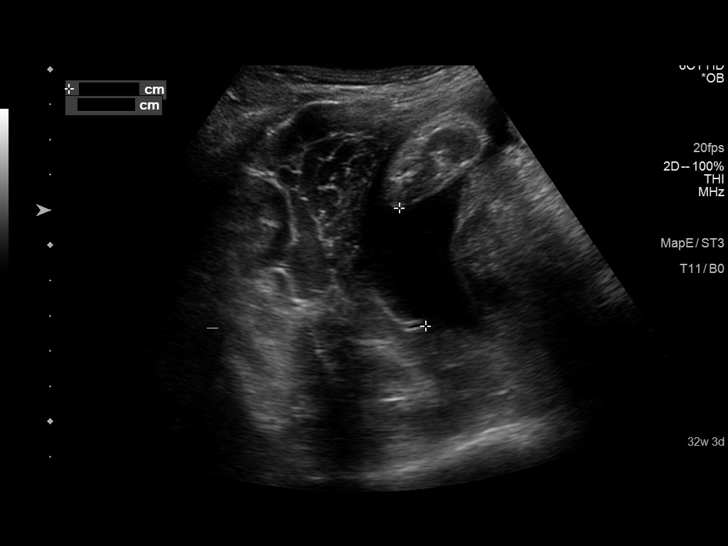
[im 45/68]
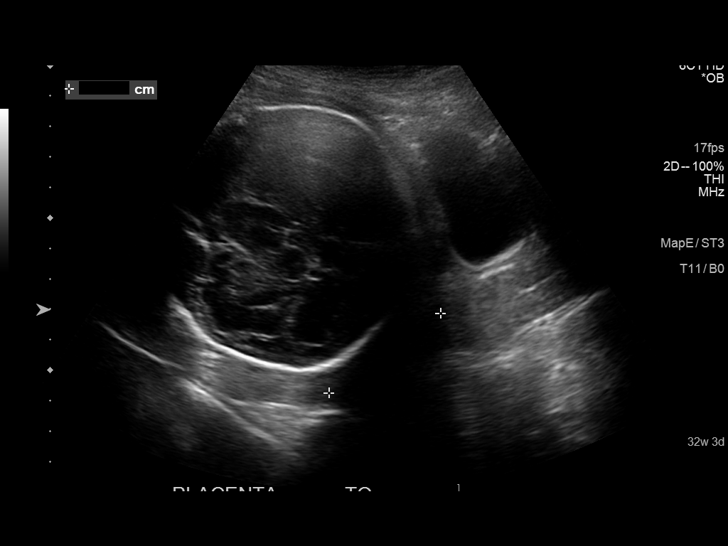
[im 50/68]
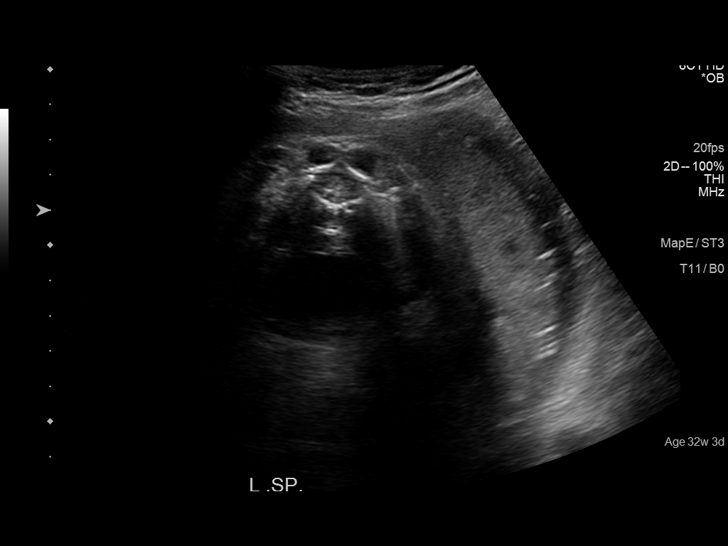
[im 55/68]
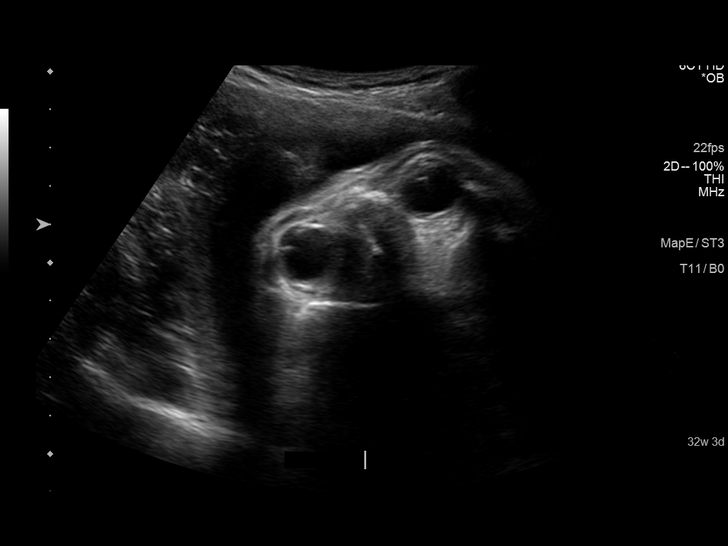
[im 60/68]
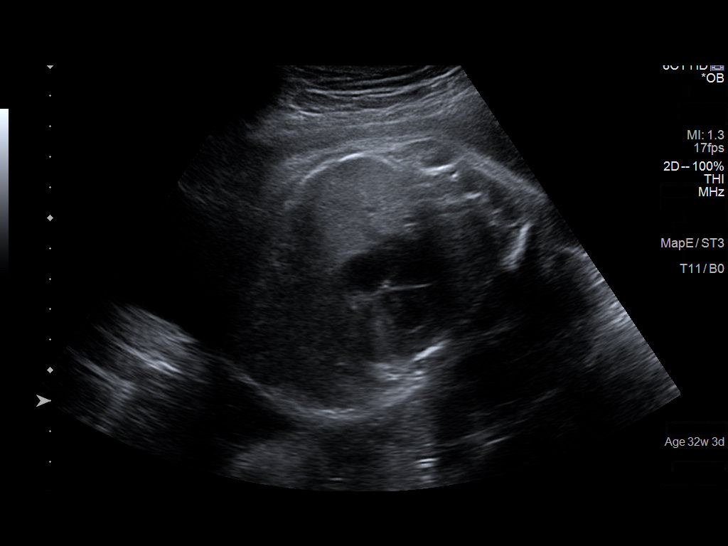
[im 65/68]
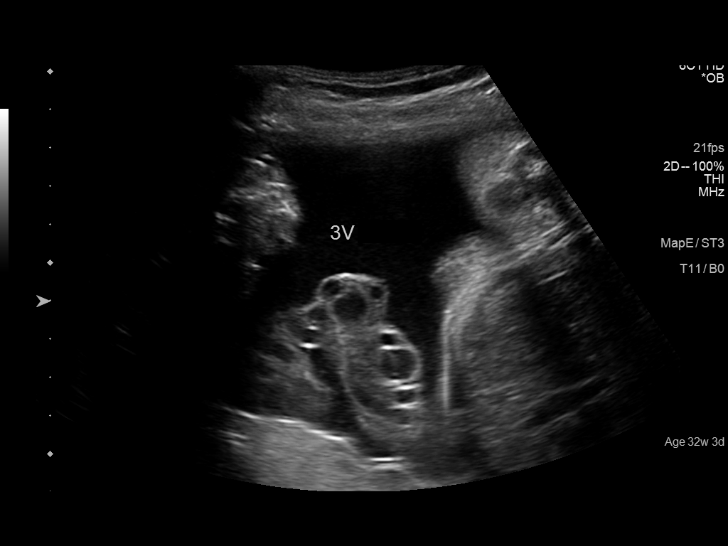

[13 of 28 positions shown; findings below may reference images not displayed]

FINDINGS: Number of Fetuses: 1

Heart Rate:  132 bpm

Movement: Present

Presentation: Cephalic

Previa: No

Placental Location: Posterior 4.5 cm from the cervical internal os.
No evidence of previa or abruption.

Amniotic Fluid (Subjective): Normal

Amniotic Fluid (Objective):

AFI 15 cm

FETAL BIOMETRY

BPD:  8.6cm 34Amnon Tiger

HC:    30.9cm  34Kinami   Meachen

AC:   29.8cm  33w   5d

FL:   6.6cm  33w   5d

Current Mean GA: 34w 1d              US EDC: 10/03/2016

Estimated Fetal Weight:  2,304g    50%ile

FETAL ANATOMY

Lateral Ventricles: Not well visualized

Thalami/CSP: Not well visualized

Posterior Fossa:  Not well visualized

Nuchal Region: Not well visualized

Upper Lip: Not well visualized

Spine: Visualized

4 Chamber Heart on Left: Visualized

LVOT: Not well visualized

RVOT: Not well visualized

Stomach on Left: Visualized

3 Vessel Cord: Visualized

Cord Insertion site: Not well visualized

Kidneys: Visualized

Bladder: Visualized

Extremities: Not well visualized

Technically difficult due to: Difficulty of patient lying still

Maternal Findings:

Cervix:  Closed.
IMPRESSION: 1. Limited exam due to patient motion. Single viable intrauterine
pregnancy at 34 weeks 1 day. Fetal heart rate 132 beats per minute.

2. No placenta previa or abruption.

## 2019-04-08 IMAGING — US US OB LIMITED
1 series · 10 of 10 positions shown · non-contrast
Comparison: none

CLINICAL DATA: Leakage of fluid. Evaluate AFI. Thirty-five weeks 2
days by LMP EDC of 10/25/2016.

EXAM:
LIMITED OBSTETRIC ULTRASOUND

[Series 1: us ob limited · 0.23mm/px · 10 of 10 slices shown]
[im 1/10]
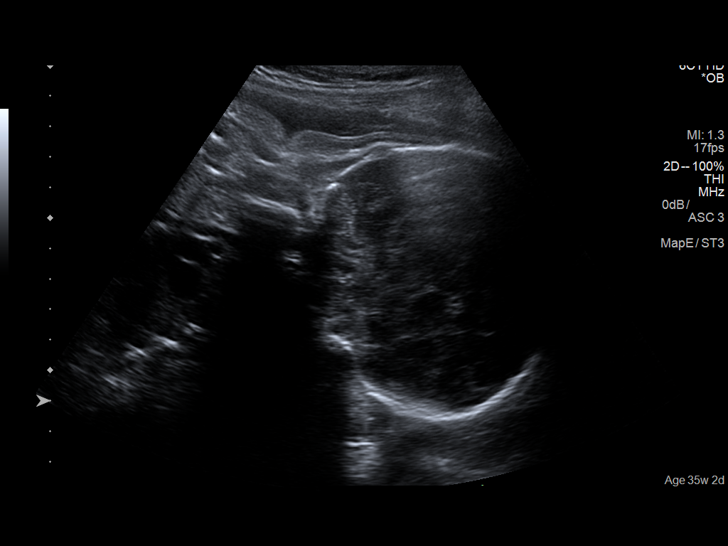
[im 2/10]
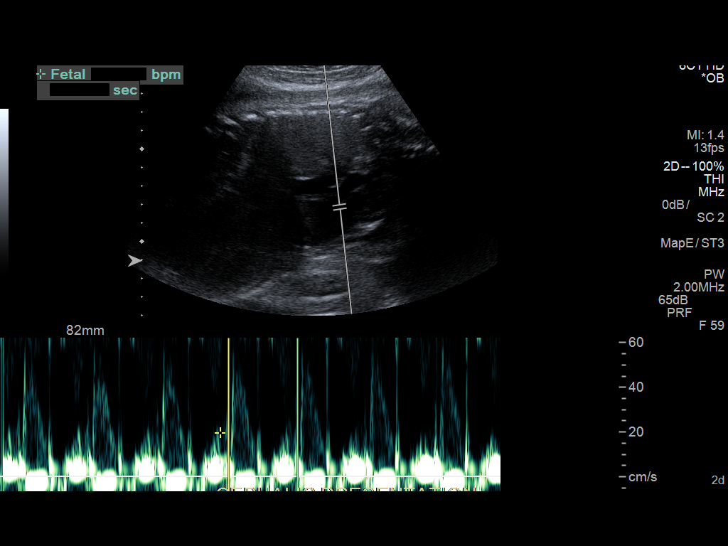
[im 3/10]
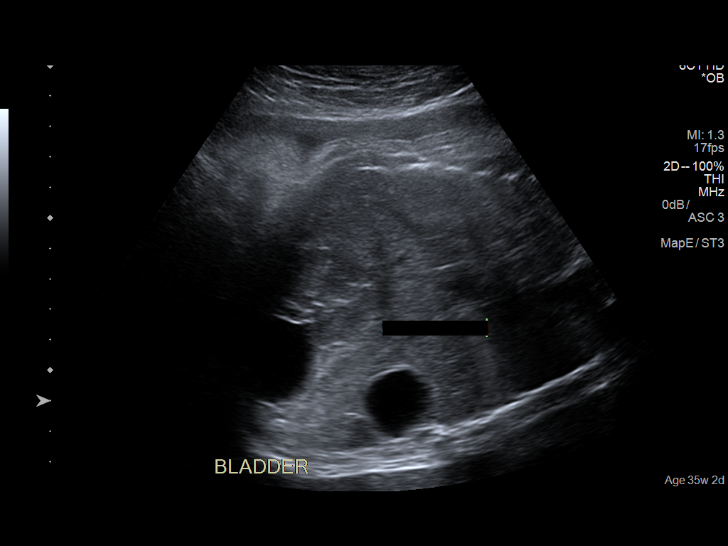
[im 4/10]
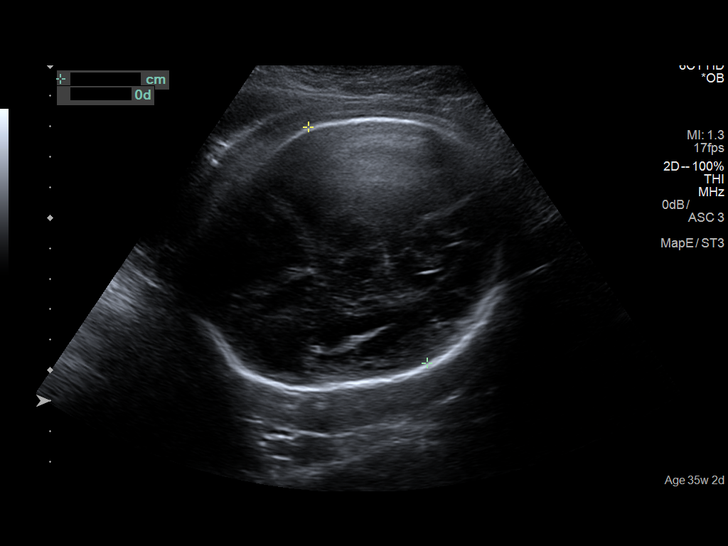
[im 5/10]
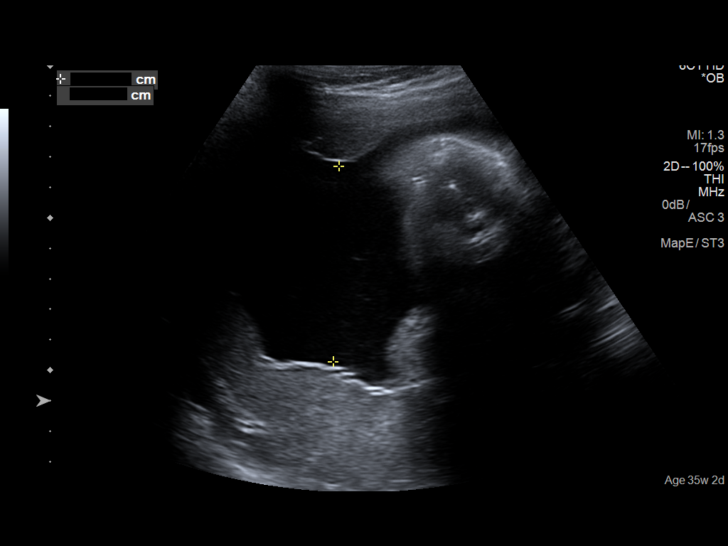
[im 6/10]
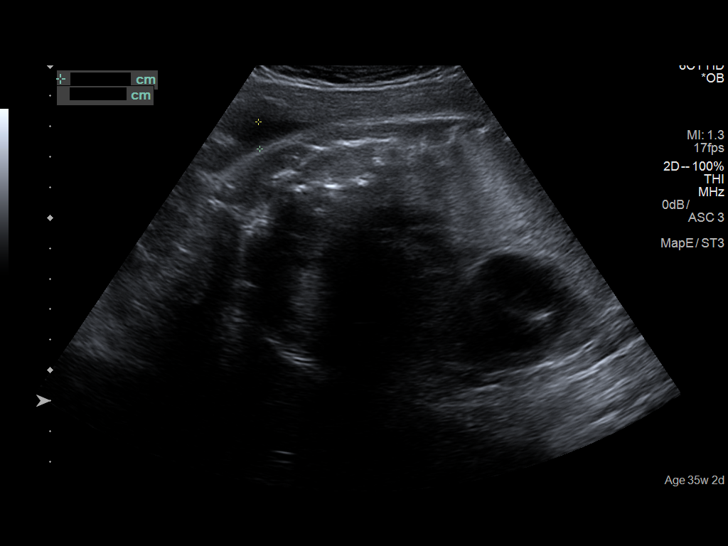
[im 7/10]
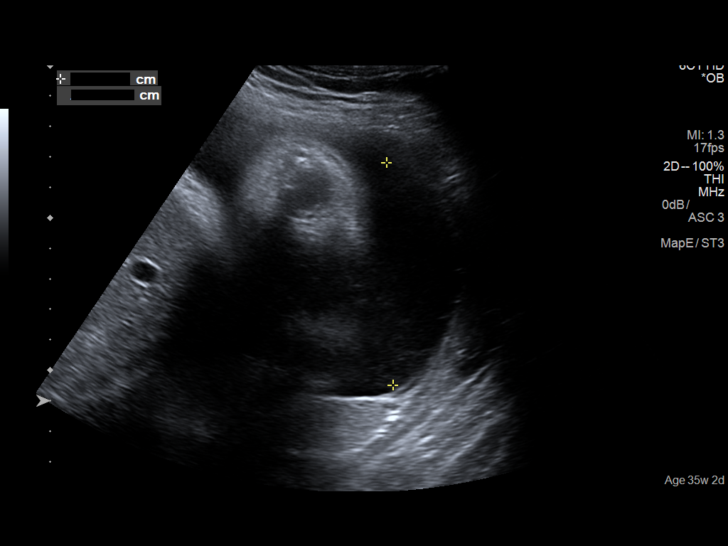
[im 8/10]
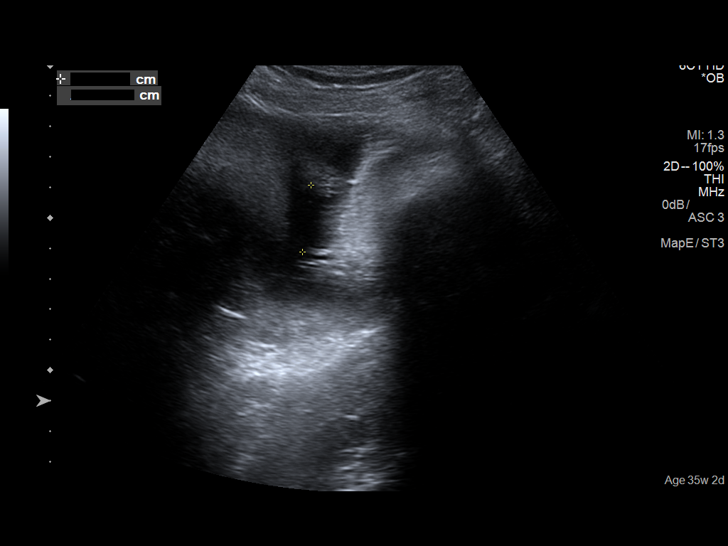
[im 9/10]
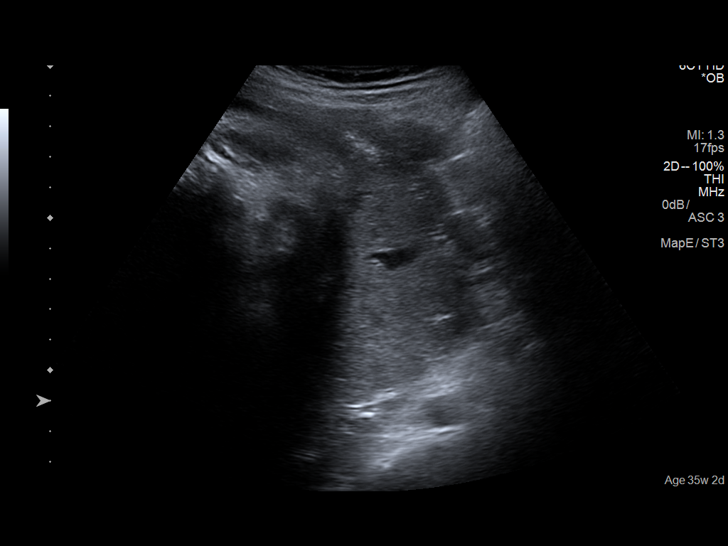
[im 10/10]
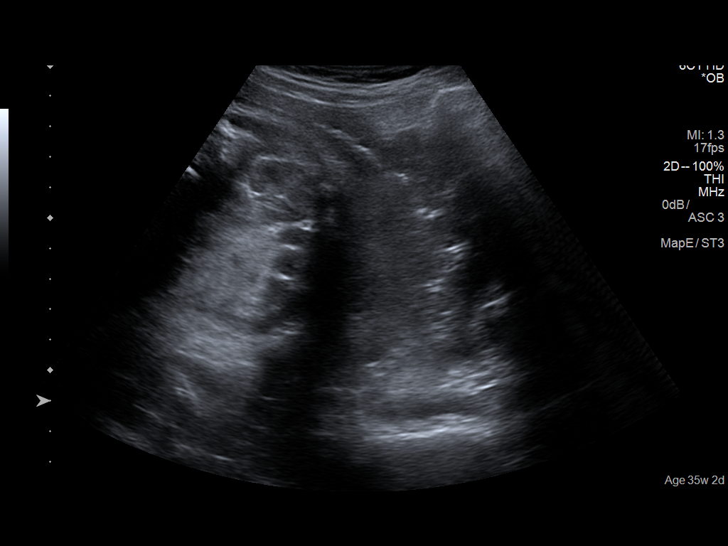

[10 of 10 positions shown; findings below may reference images not displayed]

FINDINGS: Number of Fetuses: 1

Heart Rate:  149 bpm

Movement: Present

Presentation: Cephalic

Placental Location: Left lateral

Previa: None

Amniotic Fluid (Subjective):  Normal

Amniotic Fluid index: 16.8 cm

BPD:  Not obtained

MATERNAL FINDINGS:

Cervix:  Not assessed

Uterus/Adnexae: Not assessed
IMPRESSION: 1. Single living intrauterine fetus in cephalic presentation.
2. No evidence for placenta previa.
3. Amniotic fluid volume is subjectively and objectively normal.
This exam is performed on an emergent basis and does not
comprehensively evaluate fetal size, dating, or anatomy; follow-up
complete OB US should be considered if further fetal assessment is
warranted.
